# Patient Record
Sex: Male | Born: 2000 | Race: White | Hispanic: No | Marital: Single | Smoking: Current every day smoker
Health system: Southern US, Community
[De-identification: ages and names within clinical notes are randomized; demographics above are authoritative.]

## PROBLEM LIST (undated history)

## (undated) DIAGNOSIS — F32A Depression, unspecified: Secondary | ICD-10-CM

## (undated) DIAGNOSIS — R41840 Attention and concentration deficit: Secondary | ICD-10-CM

## (undated) DIAGNOSIS — F419 Anxiety disorder, unspecified: Secondary | ICD-10-CM

## (undated) DIAGNOSIS — M24021 Loose body in right elbow: Secondary | ICD-10-CM

---
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
## 2020-01-09 NOTE — Telephone Encounter (Signed)
Called mom a few times to let her know that he would need to be seen for this issue --call keeps going to voice mail

## 2020-06-17 DIAGNOSIS — M24021 Loose body in right elbow: Secondary | ICD-10-CM | POA: Diagnosis not present

## 2020-07-01 DIAGNOSIS — M25521 Pain in right elbow: Secondary | ICD-10-CM | POA: Diagnosis not present

## 2020-07-17 DIAGNOSIS — M24021 Loose body in right elbow: Secondary | ICD-10-CM | POA: Diagnosis not present

## 2020-07-21 ENCOUNTER — Encounter (HOSPITAL_BASED_OUTPATIENT_CLINIC_OR_DEPARTMENT_OTHER): Payer: Self-pay | Admitting: Orthopedic Surgery

## 2020-07-21 ENCOUNTER — Other Ambulatory Visit: Payer: Self-pay

## 2020-07-22 ENCOUNTER — Encounter (HOSPITAL_BASED_OUTPATIENT_CLINIC_OR_DEPARTMENT_OTHER): Payer: Self-pay | Admitting: Orthopedic Surgery

## 2020-07-22 DIAGNOSIS — F419 Anxiety disorder, unspecified: Secondary | ICD-10-CM | POA: Diagnosis present

## 2020-07-22 DIAGNOSIS — R41840 Attention and concentration deficit: Secondary | ICD-10-CM | POA: Diagnosis present

## 2020-07-22 DIAGNOSIS — F32A Depression, unspecified: Secondary | ICD-10-CM | POA: Diagnosis present

## 2020-07-22 DIAGNOSIS — M24021 Loose body in right elbow: Secondary | ICD-10-CM | POA: Diagnosis present

## 2020-07-22 NOTE — H&P (Signed)
Joshua Preston is an 19 y.o. male.   Chief Complaint: right elbow  HPI: Joshua Preston is an 19 year-old seen for follow up from his persistent right elbow pain.  He underwent an MRI that has revealed multiple loose bodies in the elbow, as well as some nodular synovitis in the posterior olecranon fossa, as well as partial thickness cartilage loss of the distal ulna, consistent with a previous OCD lesion.  He continues to have significant pain.  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  Past Medical History:  Diagnosis Date  . Anxiety   . Attention deficit   . Depression   . Loose body of right elbow     History reviewed. No pertinent surgical history.  Family History  Problem Relation Age of Onset  . ADD / ADHD Mother   . ADD / ADHD Sister   . Heart disease Maternal Grandmother   . Hypertension Maternal Grandmother   . Depression Maternal Grandfather   . Alcohol abuse Maternal Grandfather   . Arthritis Neg Hx   . Asthma Neg Hx   . Birth defects Neg Hx   . Cancer Neg Hx   . COPD Neg Hx   . Diabetes Neg Hx   . Drug abuse Neg Hx   . Early death Neg Hx   . Hearing loss Neg Hx   . Hyperlipidemia Neg Hx   . Kidney disease Neg Hx   . Varicose Veins Neg Hx   . Vision loss Neg Hx   . Stroke Neg Hx   . Miscarriages / Stillbirths Neg Hx   . Mental retardation Neg Hx   . Mental illness Neg Hx   . Learning disabilities Neg Hx    Social History:  reports that he has been smoking e-cigarettes. He has never used smokeless tobacco. He reports current alcohol use. He reports that he does not use drugs.  Allergies: No Known Allergies  No medications prior to admission.    No results found for this or any previous visit (from the past 48 hour(s)). No results found.  Review of Systems  Constitutional: Negative.   HENT: Negative.   Eyes: Negative.   Respiratory: Negative.   Cardiovascular: Negative.   Gastrointestinal:  Negative.   Endocrine: Negative.   Genitourinary: Negative.   Musculoskeletal: Positive for arthralgias and joint swelling.  Skin: Negative.   Allergic/Immunologic: Negative.   Neurological: Negative.   Hematological: Negative.   Psychiatric/Behavioral: Negative.     Height 5\' 10"  (1.778 m), weight 83.9 kg. Physical Exam Constitutional:      Appearance: Normal appearance.  HENT:     Head: Atraumatic.     Left Ear: External ear normal.     Nose: Nose normal.     Mouth/Throat:     Mouth: Mucous membranes are moist.     Pharynx: Oropharynx is clear.  Eyes:     Extraocular Movements: Extraocular movements intact.     Conjunctiva/sclera: Conjunctivae normal.  Cardiovascular:     Rate and Rhythm: Normal rate.     Pulses: Normal pulses.  Pulmonary:     Effort: Pulmonary effort is normal.  Abdominal:     General: Bowel sounds are normal.     Palpations: Abdomen is soft.  Genitourinary:    Comments: Not pertinent to current symptomatology therefore not examined. Musculoskeletal:     Cervical back: Neck supple.     Comments:  Examination of his right elbow reveals pain  with 1+ effusion.  Range of motion -5 to 130 degrees.  Elbow is stable.    Neurological:     Mental Status: He is alert.      Assessment Principal Problem:   Loose body of right elbow Active Problems:   Depression   Attention deficit   Anxiety   Plan At this point I have talked to him about this predicament in detail.  Would recommend with these findings and lack of response to conservative care that we proceed with right elbow arthroscopy with loose body excision and synovectomy.  Risks, complications and benefits of the surgery have been described to him in detail and he understands this completely.  We place him out of work today for the next two months.  Place him in a spider elbow sleeve.  We will plan on setting him up for this procedure at some point in the near future.   Verline Kong J Vetta Couzens,  PA-C 07/22/2020, 2:40 PM

## 2020-07-24 ENCOUNTER — Other Ambulatory Visit (HOSPITAL_COMMUNITY)
Admission: RE | Admit: 2020-07-24 | Discharge: 2020-07-24 | Disposition: A | Discharge status: Home / Self Care | Payer: Medicaid Other | Source: Ambulatory Visit | Attending: Orthopedic Surgery | Admitting: Orthopedic Surgery

## 2020-07-24 DIAGNOSIS — Z01818 Encounter for other preprocedural examination: Secondary | ICD-10-CM | POA: Diagnosis not present

## 2020-07-24 DIAGNOSIS — Z20822 Contact with and (suspected) exposure to covid-19: Secondary | ICD-10-CM | POA: Diagnosis not present

## 2020-07-24 LAB — SARS CORONAVIRUS 2 (TAT 6-24 HRS): SARS Coronavirus 2: NEGATIVE

## 2020-07-28 ENCOUNTER — Encounter (HOSPITAL_BASED_OUTPATIENT_CLINIC_OR_DEPARTMENT_OTHER)
Admission: RE | Disposition: A | Discharge status: Home / Self Care | Payer: Self-pay | Source: Home / Self Care | Attending: Orthopedic Surgery

## 2020-07-28 ENCOUNTER — Ambulatory Visit (HOSPITAL_BASED_OUTPATIENT_CLINIC_OR_DEPARTMENT_OTHER): Payer: Medicaid Other | Admitting: Anesthesiology

## 2020-07-28 ENCOUNTER — Other Ambulatory Visit: Payer: Self-pay

## 2020-07-28 ENCOUNTER — Encounter (HOSPITAL_BASED_OUTPATIENT_CLINIC_OR_DEPARTMENT_OTHER): Payer: Self-pay | Admitting: Orthopedic Surgery

## 2020-07-28 ENCOUNTER — Ambulatory Visit (HOSPITAL_BASED_OUTPATIENT_CLINIC_OR_DEPARTMENT_OTHER)
Admission: RE | Admit: 2020-07-28 | Discharge: 2020-07-28 | Disposition: A | Discharge status: Home / Self Care | Payer: Medicaid Other | Attending: Orthopedic Surgery | Admitting: Orthopedic Surgery

## 2020-07-28 DIAGNOSIS — M24021 Loose body in right elbow: Secondary | ICD-10-CM | POA: Diagnosis present

## 2020-07-28 DIAGNOSIS — F419 Anxiety disorder, unspecified: Secondary | ICD-10-CM | POA: Diagnosis present

## 2020-07-28 DIAGNOSIS — F17290 Nicotine dependence, other tobacco product, uncomplicated: Secondary | ICD-10-CM | POA: Insufficient documentation

## 2020-07-28 DIAGNOSIS — M93221 Osteochondritis dissecans, right elbow: Secondary | ICD-10-CM | POA: Diagnosis not present

## 2020-07-28 DIAGNOSIS — S53401A Unspecified sprain of right elbow, initial encounter: Secondary | ICD-10-CM | POA: Diagnosis not present

## 2020-07-28 DIAGNOSIS — F32A Depression, unspecified: Secondary | ICD-10-CM | POA: Diagnosis present

## 2020-07-28 DIAGNOSIS — M94221 Chondromalacia, right elbow: Secondary | ICD-10-CM | POA: Diagnosis not present

## 2020-07-28 DIAGNOSIS — R41840 Attention and concentration deficit: Secondary | ICD-10-CM | POA: Diagnosis present

## 2020-07-28 DIAGNOSIS — X58XXXA Exposure to other specified factors, initial encounter: Secondary | ICD-10-CM | POA: Diagnosis not present

## 2020-07-28 DIAGNOSIS — F418 Other specified anxiety disorders: Secondary | ICD-10-CM | POA: Diagnosis not present

## 2020-07-28 HISTORY — PX: ELBOW ARTHROSCOPY: SHX614

## 2020-07-28 HISTORY — DX: Depression, unspecified: F32.A

## 2020-07-28 HISTORY — DX: Anxiety disorder, unspecified: F41.9

## 2020-07-28 HISTORY — DX: Loose body in right elbow: M24.021

## 2020-07-28 SURGERY — ARTHROSCOPY, ELBOW
Anesthesia: General | Site: Elbow | Laterality: Right

## 2020-07-28 MED ORDER — MIDAZOLAM HCL 2 MG/2ML IJ SOLN
INTRAMUSCULAR | Status: AC
  Filled 2020-07-28: qty 2

## 2020-07-28 MED ORDER — ONDANSETRON HCL 4 MG/2ML IJ SOLN
INTRAMUSCULAR | Status: AC
  Filled 2020-07-28: qty 2

## 2020-07-28 MED ORDER — FENTANYL CITRATE (PF) 100 MCG/2ML IJ SOLN
INTRAMUSCULAR | Status: DC | PRN
  Administered 2020-07-28 (×2): 50 ug via INTRAVENOUS
  Administered 2020-07-28: 100 ug via INTRAVENOUS

## 2020-07-28 MED ORDER — PROPOFOL 10 MG/ML IV BOLUS
INTRAVENOUS | Status: AC
  Filled 2020-07-28: qty 20

## 2020-07-28 MED ORDER — FENTANYL CITRATE (PF) 100 MCG/2ML IJ SOLN
INTRAMUSCULAR | Status: AC
  Filled 2020-07-28: qty 2

## 2020-07-28 MED ORDER — LIDOCAINE 2% (20 MG/ML) 5 ML SYRINGE
INTRAMUSCULAR | Status: AC
  Filled 2020-07-28: qty 5

## 2020-07-28 MED ORDER — DEXAMETHASONE SODIUM PHOSPHATE 10 MG/ML IJ SOLN: 8.0000 mg | Freq: Once | INTRAMUSCULAR | Status: DC

## 2020-07-28 MED ORDER — SODIUM CHLORIDE 0.9 % IR SOLN
Status: DC | PRN
  Administered 2020-07-28: 1500 mL

## 2020-07-28 MED ORDER — BUPIVACAINE-EPINEPHRINE 0.25% -1:200000 IJ SOLN
INTRAMUSCULAR | Status: DC | PRN
  Administered 2020-07-28: 20 mL

## 2020-07-28 MED ORDER — POVIDONE-IODINE 10 % EX SWAB: 2.0000 "application " | Freq: Once | CUTANEOUS | Status: DC

## 2020-07-28 MED ORDER — HYDROCODONE-ACETAMINOPHEN 5-325 MG PO TABS: 1.0000 | ORAL_TABLET | ORAL | 0 refills | Status: DC | PRN

## 2020-07-28 MED ORDER — DEXAMETHASONE SODIUM PHOSPHATE 10 MG/ML IJ SOLN
INTRAMUSCULAR | Status: DC | PRN
  Administered 2020-07-28: 10 mg via INTRAVENOUS

## 2020-07-28 MED ORDER — ONDANSETRON HCL 4 MG/2ML IJ SOLN
INTRAMUSCULAR | Status: DC | PRN
  Administered 2020-07-28: 4 mg via INTRAVENOUS

## 2020-07-28 MED ORDER — PROPOFOL 10 MG/ML IV BOLUS
INTRAVENOUS | Status: DC | PRN
  Administered 2020-07-28: 160 mg via INTRAVENOUS

## 2020-07-28 MED ORDER — EPINEPHRINE PF 1 MG/ML IJ SOLN
INTRAMUSCULAR | Status: AC
  Filled 2020-07-28: qty 1

## 2020-07-28 MED ORDER — LACTATED RINGERS IV SOLN: INTRAVENOUS | Status: DC

## 2020-07-28 MED ORDER — SUGAMMADEX SODIUM 200 MG/2ML IV SOLN
INTRAVENOUS | Status: DC | PRN
  Administered 2020-07-28: 200 mg via INTRAVENOUS

## 2020-07-28 MED ORDER — DEXAMETHASONE SODIUM PHOSPHATE 10 MG/ML IJ SOLN
INTRAMUSCULAR | Status: AC
  Filled 2020-07-28: qty 1

## 2020-07-28 MED ORDER — MIDAZOLAM HCL 5 MG/5ML IJ SOLN
INTRAMUSCULAR | Status: DC | PRN
  Administered 2020-07-28: 2 mg via INTRAVENOUS

## 2020-07-28 MED ORDER — OXYCODONE HCL 5 MG PO TABS
ORAL_TABLET | ORAL | Status: AC
  Filled 2020-07-28: qty 1

## 2020-07-28 MED ORDER — MEPERIDINE HCL 25 MG/ML IJ SOLN: 6.2500 mg | INTRAMUSCULAR | Status: DC | PRN

## 2020-07-28 MED ORDER — PROMETHAZINE HCL 25 MG/ML IJ SOLN: 6.2500 mg | INTRAMUSCULAR | Status: DC | PRN

## 2020-07-28 MED ORDER — AMISULPRIDE (ANTIEMETIC) 5 MG/2ML IV SOLN: 10.0000 mg | Freq: Once | INTRAVENOUS | Status: DC | PRN

## 2020-07-28 MED ORDER — OXYCODONE HCL 5 MG PO TABS
5.0000 mg | ORAL_TABLET | Freq: Once | ORAL | Status: AC | PRN
  Administered 2020-07-28: 5 mg via ORAL

## 2020-07-28 MED ORDER — OXYCODONE HCL 5 MG/5ML PO SOLN: 5.0000 mg | Freq: Once | ORAL | Status: AC | PRN

## 2020-07-28 MED ORDER — CEFAZOLIN SODIUM-DEXTROSE 2-4 GM/100ML-% IV SOLN
INTRAVENOUS | Status: AC
  Filled 2020-07-28: qty 100

## 2020-07-28 MED ORDER — CEFAZOLIN SODIUM-DEXTROSE 2-4 GM/100ML-% IV SOLN
2.0000 g | INTRAVENOUS | Status: AC
  Administered 2020-07-28: 2 g via INTRAVENOUS

## 2020-07-28 MED ORDER — ROCURONIUM BROMIDE 100 MG/10ML IV SOLN
INTRAVENOUS | Status: DC | PRN
  Administered 2020-07-28: 80 mg via INTRAVENOUS

## 2020-07-28 MED ORDER — HYDROMORPHONE HCL 1 MG/ML IJ SOLN
0.2500 mg | INTRAMUSCULAR | Status: DC | PRN
  Administered 2020-07-28: 0.25 mg via INTRAVENOUS

## 2020-07-28 MED ORDER — HYDROMORPHONE HCL 1 MG/ML IJ SOLN
INTRAMUSCULAR | Status: AC
  Filled 2020-07-28: qty 0.5

## 2020-07-28 MED ORDER — POVIDONE-IODINE 7.5 % EX SOLN: Freq: Once | CUTANEOUS | Status: DC

## 2020-07-28 MED ORDER — LIDOCAINE 2% (20 MG/ML) 5 ML SYRINGE
INTRAMUSCULAR | Status: DC | PRN
  Administered 2020-07-28: 80 mg via INTRAVENOUS

## 2020-07-28 MED ORDER — BUPIVACAINE-EPINEPHRINE (PF) 0.25% -1:200000 IJ SOLN
INTRAMUSCULAR | Status: AC
  Filled 2020-07-28: qty 30

## 2020-07-28 SURGICAL SUPPLY — 63 items
APL SKNCLS STERI-STRIP NONHPOA (GAUZE/BANDAGES/DRESSINGS)
BENZOIN TINCTURE PRP APPL 2/3 (GAUZE/BANDAGES/DRESSINGS) IMPLANT
BLADE EXCALIBUR 4.0MM X 13CM (MISCELLANEOUS)
BLADE EXCALIBUR 4.0X13 (MISCELLANEOUS) IMPLANT
BNDG CMPR 9X4 STRL LF SNTH (GAUZE/BANDAGES/DRESSINGS) ×1
BNDG COHESIVE 4X5 TAN STRL (GAUZE/BANDAGES/DRESSINGS) IMPLANT
BNDG CONFORM 3 STRL LF (GAUZE/BANDAGES/DRESSINGS) IMPLANT
BNDG ELASTIC 4X5.8 VLCR STR LF (GAUZE/BANDAGES/DRESSINGS) ×6 IMPLANT
BNDG ESMARK 4X9 LF (GAUZE/BANDAGES/DRESSINGS) ×2 IMPLANT
CANISTER SUCT 1200ML W/VALVE (MISCELLANEOUS) ×3 IMPLANT
CLOSURE WOUND 1/2 X4 (GAUZE/BANDAGES/DRESSINGS)
COVER WAND RF STERILE (DRAPES) IMPLANT
CUFF TOURN SGL QUICK 18X4 (TOURNIQUET CUFF) IMPLANT
DECANTER SPIKE VIAL GLASS SM (MISCELLANEOUS) IMPLANT
DISSECTOR  3.8MM X 13CM (MISCELLANEOUS) ×3
DISSECTOR 3.8MM X 13CM (MISCELLANEOUS) ×1 IMPLANT
DISSECTOR 4.0MM X 13CM (MISCELLANEOUS) IMPLANT
DRAPE ARTHROSCOPY W/POUCH 90 (DRAPES) ×3 IMPLANT
DRAPE OEC MINIVIEW 54X84 (DRAPES) IMPLANT
DRAPE U-SHAPE 47X51 STRL (DRAPES) ×3 IMPLANT
DURAPREP 26ML APPLICATOR (WOUND CARE) ×3 IMPLANT
EXCALIBUR 3.8MM X 13CM (MISCELLANEOUS) IMPLANT
GAUZE SPONGE 4X4 12PLY STRL (GAUZE/BANDAGES/DRESSINGS) ×3 IMPLANT
GAUZE XEROFORM 1X8 LF (GAUZE/BANDAGES/DRESSINGS) ×2 IMPLANT
GLOVE BIO SURGEON STRL SZ7 (GLOVE) ×3 IMPLANT
GLOVE BIOGEL PI IND STRL 7.0 (GLOVE) ×1 IMPLANT
GLOVE BIOGEL PI IND STRL 7.5 (GLOVE) ×1 IMPLANT
GLOVE BIOGEL PI INDICATOR 7.0 (GLOVE) ×2
GLOVE BIOGEL PI INDICATOR 7.5 (GLOVE) ×2
GLOVE SS BIOGEL STRL SZ 7.5 (GLOVE) ×1 IMPLANT
GLOVE SUPERSENSE BIOGEL SZ 7.5 (GLOVE) ×2
GOWN STRL REUS W/ TWL LRG LVL3 (GOWN DISPOSABLE) ×2 IMPLANT
GOWN STRL REUS W/ TWL XL LVL3 (GOWN DISPOSABLE) ×2 IMPLANT
GOWN STRL REUS W/TWL LRG LVL3 (GOWN DISPOSABLE) ×6
GOWN STRL REUS W/TWL XL LVL3 (GOWN DISPOSABLE) ×6
NDL HYPO 25X1 1.5 SAFETY (NEEDLE) IMPLANT
NDL SAFETY ECLIPSE 18X1.5 (NEEDLE) IMPLANT
NEEDLE HYPO 18GX1.5 SHARP (NEEDLE)
NEEDLE HYPO 25X1 1.5 SAFETY (NEEDLE) IMPLANT
NS IRRIG 1000ML POUR BTL (IV SOLUTION) IMPLANT
PACK ARTHROSCOPY DSU (CUSTOM PROCEDURE TRAY) ×3 IMPLANT
PACK BASIN DAY SURGERY FS (CUSTOM PROCEDURE TRAY) ×3 IMPLANT
PAD CAST 4YDX4 CTTN HI CHSV (CAST SUPPLIES) ×3 IMPLANT
PADDING CAST ABS 4INX4YD NS (CAST SUPPLIES) ×2
PADDING CAST ABS COTTON 4X4 ST (CAST SUPPLIES) ×1 IMPLANT
PADDING CAST COTTON 4X4 STRL (CAST SUPPLIES) ×9
SHEET MEDIUM DRAPE 40X70 STRL (DRAPES) ×3 IMPLANT
SLEEVE SCD COMPRESS KNEE MED (MISCELLANEOUS) IMPLANT
SLING ARM FOAM STRAP LRG (SOFTGOODS) IMPLANT
SPLINT FAST PLASTER 5X30 (CAST SUPPLIES)
SPLINT PLASTER CAST FAST 5X30 (CAST SUPPLIES) IMPLANT
STRIP CLOSURE SKIN 1/2X4 (GAUZE/BANDAGES/DRESSINGS) IMPLANT
SUT ETHILON 4 0 PS 2 18 (SUTURE) ×3 IMPLANT
SUT VIC AB 0 CT1 27 (SUTURE)
SUT VIC AB 0 CT1 27XBRD ANBCTR (SUTURE) IMPLANT
SUT VIC AB 2-0 PS2 27 (SUTURE) IMPLANT
SUT VIC AB 2-0 SH 27 (SUTURE) ×3
SUT VIC AB 2-0 SH 27XBRD (SUTURE) ×1 IMPLANT
SYR CONTROL 10ML LL (SYRINGE) IMPLANT
TOWEL GREEN STERILE FF (TOWEL DISPOSABLE) ×3 IMPLANT
TUBING ARTHROSCOPY IRRIG 16FT (MISCELLANEOUS) ×3 IMPLANT
UNDERPAD 30X36 HEAVY ABSORB (UNDERPADS AND DIAPERS) ×3 IMPLANT
WATER STERILE IRR 1000ML POUR (IV SOLUTION) ×3 IMPLANT

## 2020-07-28 NOTE — Anesthesia Preprocedure Evaluation (Signed)
Anesthesia Evaluation  Patient identified by MRN, date of birth, ID band Patient awake    Reviewed: Allergy & Precautions, NPO status , Patient's Chart, lab work & pertinent test results  Airway Mallampati: II  TM Distance: >3 FB Neck ROM: Full    Dental no notable dental hx.    Pulmonary neg pulmonary ROS, Current Smoker and Patient abstained from smoking.,    Pulmonary exam normal breath sounds clear to auscultation       Cardiovascular negative cardio ROS Normal cardiovascular exam Rhythm:Regular Rate:Normal     Neuro/Psych Anxiety Depression negative neurological ROS  negative psych ROS   GI/Hepatic negative GI ROS, Neg liver ROS,   Endo/Other  negative endocrine ROS  Renal/GU negative Renal ROS  negative genitourinary   Musculoskeletal negative musculoskeletal ROS (+)   Abdominal   Peds negative pediatric ROS (+)  Hematology negative hematology ROS (+)   Anesthesia Other Findings   Reproductive/Obstetrics negative OB ROS                             Anesthesia Physical Anesthesia Plan  ASA: II  Anesthesia Plan: General   Post-op Pain Management:    Induction: Intravenous  PONV Risk Score and Plan: 1 and Ondansetron and Treatment may vary due to age or medical condition  Airway Management Planned: Oral ETT  Additional Equipment:   Intra-op Plan:   Post-operative Plan: Extubation in OR  Informed Consent: I have reviewed the patients History and Physical, chart, labs and discussed the procedure including the risks, benefits and alternatives for the proposed anesthesia with the patient or authorized representative who has indicated his/her understanding and acceptance.     Dental advisory given  Plan Discussed with: CRNA  Anesthesia Plan Comments:         Anesthesia Quick Evaluation

## 2020-07-28 NOTE — Transfer of Care (Signed)
Immediate Anesthesia Transfer of Care Note  Patient: Joshua Preston  Procedure(s) Performed: ARTHROSCOPY RIGHT ELBOW WITH DEBRIDEMENT AND LOOSE BODY EXCISION (Right Elbow)  Patient Location: PACU  Anesthesia Type:General  Level of Consciousness: awake, alert  and oriented  Airway & Oxygen Therapy: Patient Spontanous Breathing and Patient connected to face mask oxygen  Post-op Assessment: Report given to RN and Post -op Vital signs reviewed and stable  Post vital signs: Reviewed and stable  Last Vitals:  Vitals Value Taken Time  BP 140/81 07/28/20 1247  Temp 36.7 C 07/28/20 1247  Pulse 79 07/28/20 1250  Resp 19 07/28/20 1250  SpO2 100 % 07/28/20 1250  Vitals shown include unvalidated device data.  Last Pain:  Vitals:   07/28/20 0945  TempSrc: Oral  PainSc: 4       Patients Stated Pain Goal: 5 (07/28/20 0945)  Complications: No complications documented.

## 2020-07-28 NOTE — Interval H&P Note (Signed)
History and Physical Interval Note:  07/28/2020 11:00 AM  Joshua Preston  has presented today for surgery, with the diagnosis of LOOSE BODY IN RIGHT ELBOW.  The various methods of treatment have been discussed with the patient and family. After consideration of risks, benefits and other options for treatment, the patient has consented to  Procedure(s): ARTHROSCOPY RIGHT ELBOW WITH DEBRIDEMENT AND LOOSE BODY EXCISION (Right) as a surgical intervention.  [name] history has been reviewed, patient examined, no change in status, stable for surgery.  I have reviewed the patient's chart and labs.  Questions were answered to the patient's satisfaction.     Nilda Simmer

## 2020-07-28 NOTE — Anesthesia Postprocedure Evaluation (Signed)
Anesthesia Post Note  Patient: Joshua Preston  Procedure(s) Performed: ARTHROSCOPY RIGHT ELBOW WITH DEBRIDEMENT AND LOOSE BODY EXCISION (Right Elbow)     Patient location during evaluation: PACU Anesthesia Type: General Level of consciousness: awake and alert Pain management: pain level controlled Vital Signs Assessment: post-procedure vital signs reviewed and stable Respiratory status: spontaneous breathing, nonlabored ventilation and respiratory function stable Cardiovascular status: blood pressure returned to baseline and stable Postop Assessment: no apparent nausea or vomiting Anesthetic complications: no   No complications documented.  Last Vitals:  Vitals:   07/28/20 1315 07/28/20 1325  BP: 134/86 135/81  Pulse: 76 65  Resp: 17 16  Temp:    SpO2: 93% 94%    Last Pain:  Vitals:   07/28/20 1300  TempSrc:   PainSc: Asleep                 Lowella Curb

## 2020-07-28 NOTE — Anesthesia Procedure Notes (Signed)
Procedure Name: Intubation Date/Time: 07/28/2020 11:43 AM Performed by: Lavonia Dana, CRNA Pre-anesthesia Checklist: Patient identified, Emergency Drugs available, Suction available and Patient being monitored Patient Re-evaluated:Patient Re-evaluated prior to induction Oxygen Delivery Method: Circle system utilized Preoxygenation: Pre-oxygenation with 100% oxygen Induction Type: IV induction Ventilation: Mask ventilation without difficulty Laryngoscope Size: Mac and 4 Grade View: Grade I Tube type: Oral Tube size: 7.5 mm Number of attempts: 1 Airway Equipment and Method: Stylet and Bite block Placement Confirmation: ETT inserted through vocal cords under direct vision,  positive ETCO2 and breath sounds checked- equal and bilateral Secured at: 24 cm Tube secured with: Tape Dental Injury: Teeth and Oropharynx as per pre-operative assessment

## 2020-07-28 NOTE — Discharge Instructions (Signed)

## 2020-07-29 ENCOUNTER — Encounter (HOSPITAL_BASED_OUTPATIENT_CLINIC_OR_DEPARTMENT_OTHER): Payer: Self-pay | Admitting: Orthopedic Surgery

## 2020-07-29 NOTE — Op Note (Signed)
NAME: Joshua Preston, WIECK MEDICAL RECORD ZD:[id number] ACCOUNT [id number] DATE OF BIRTH:2001-02-04 FACILITY: [facility] LOCATION: [facility] PHYSICIAN:Chukwudi Ewen Salley Slaughter, MD  OPERATIVE REPORT  DATE OF PROCEDURE:  07/28/2020  PREOPERATIVE DIAGNOSES: 1.  Right elbow chronic traumatic loose bodies. 2.  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 3.  Right elbow chronic traumatic synovitis.  POSTOPERATIVE DIAGNOSES: 1.  Right elbow chronic traumatic loose bodies. 2.  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 3.  Right elbow chronic traumatic synovitis.  PROCEDURE: 1.  Right elbow EUA followed by arthroscopic excision of loose bodies and chondroplasty. 2.  Right elbow synovectomy.  SURGEON:  Salvatore Marvel, MD  ASSISTANT:  Julien Girt, PA  ANESTHESIA:  General.  OPERATIVE TIME:  45 minutes.  COMPLICATIONS:  None.  INDICATIONS FOR PROCEDURE:  The patient is an 19 year old who has had repetitive trauma to his right elbow over the past 1 to 2 years.  X-rays, exam and MRI have revealed multiple loose bodies with synovitis and evidence of a previous OCD lesion.  He has  failed conservative care and is now to undergo arthroscopy.  DESCRIPTION:  The patient was brought to the operating room on 07/28/2020.  He was placed under general anesthesia on a stretcher and then carefully turned prone on the operative table.  All pressure points and genitalia well padded.  His right elbow was  examined.  Range of motion from -5 to 135 degrees, the elbow was stable.  Elbow was sterilely injected in the anteromedial, anterolateral and posterior portals with 0.25% Marcaine with epinephrine.  The right elbow and arm was prepped using sterile  DuraPrep and draped using sterile technique.  Timeout procedure was called and the correct right elbow identified.  Initially through an anteromedial portal, carefully incising only the skin to  protect any injury to the ulnar nerve, the arthroscopic  cannula was placed without complications.  An anterolateral portal was made, also carefully protecting for any injury and preventing any injury to the radial nerve.  An arthroscopic probe was placed.  On initial inspection of the anterior compartment of  the elbow, there was moderate synovitis which was debrided.  The capitellum had chondromalacia grade III, which was debrided 50%.  Radial head showed grade I and II changes.  The coronoid and distal humerus articulation was intact.  No other pathology  was noted in the anterior compartment.  At this point, 2 posterior arthroscopic portals were made, one central posterior and one posterolateral.  The olecranon fossa was entered.  Significant synovitis was debrided.  There were 2 large loose bodies  posteriorly.  One measured 1 x 1.5 cm, one measured 1.5 x 2 cm.  A posterolateral portal had to be extended to remove these loose bodies and they were removed.  The medial and lateral gutters were inspected and there were no loose bodies or pathology  noted there.  After the synovectomy had been carried out and the loose bodies removed, there was no other pathology noted and it was felt that all pathology had been satisfactorily addressed.  The instruments were removed.  Portals closed with 3-0 nylon  suture.  Sterile dressings were applied and then the patient turned supine, awakened and extubated and taken to recovery room in stable condition.  Needle and sponge counts correct x2 at the end of the case.  FOLLOWUP CARE:  The patient will be followed as an outpatient, on Norco for pain.  I will see him back in the office in a week for  sutures out and followup.  HN/NUANCE  D:07/28/2020 T:07/29/2020 JOB:[id number]

## 2020-08-05 DIAGNOSIS — M24021 Loose body in right elbow: Secondary | ICD-10-CM | POA: Diagnosis not present

## 2020-08-09 DIAGNOSIS — M6281 Muscle weakness (generalized): Secondary | ICD-10-CM | POA: Diagnosis not present

## 2020-08-09 DIAGNOSIS — M25621 Stiffness of right elbow, not elsewhere classified: Secondary | ICD-10-CM | POA: Diagnosis not present

## 2020-08-21 DIAGNOSIS — M25621 Stiffness of right elbow, not elsewhere classified: Secondary | ICD-10-CM | POA: Diagnosis not present

## 2020-08-21 DIAGNOSIS — M6281 Muscle weakness (generalized): Secondary | ICD-10-CM | POA: Diagnosis not present

## 2020-08-27 DIAGNOSIS — M6281 Muscle weakness (generalized): Secondary | ICD-10-CM | POA: Diagnosis not present

## 2020-08-27 DIAGNOSIS — M25621 Stiffness of right elbow, not elsewhere classified: Secondary | ICD-10-CM | POA: Diagnosis not present

## 2020-09-01 DIAGNOSIS — M24021 Loose body in right elbow: Secondary | ICD-10-CM | POA: Diagnosis not present

## 2020-09-03 DIAGNOSIS — M25621 Stiffness of right elbow, not elsewhere classified: Secondary | ICD-10-CM | POA: Diagnosis not present

## 2020-09-03 DIAGNOSIS — M6281 Muscle weakness (generalized): Secondary | ICD-10-CM | POA: Diagnosis not present

## 2020-09-11 DIAGNOSIS — M6281 Muscle weakness (generalized): Secondary | ICD-10-CM | POA: Diagnosis not present

## 2020-09-11 DIAGNOSIS — M25621 Stiffness of right elbow, not elsewhere classified: Secondary | ICD-10-CM | POA: Diagnosis not present

## 2020-10-15 ENCOUNTER — Other Ambulatory Visit: Payer: Self-pay

## 2020-10-15 ENCOUNTER — Ambulatory Visit (INDEPENDENT_AMBULATORY_CARE_PROVIDER_SITE_OTHER): Payer: Medicaid Other | Admitting: Pediatrics

## 2020-10-15 VITALS — Wt 190.0 lb

## 2020-10-15 DIAGNOSIS — J101 Influenza due to other identified influenza virus with other respiratory manifestations: Secondary | ICD-10-CM | POA: Diagnosis not present

## 2020-10-15 DIAGNOSIS — J029 Acute pharyngitis, unspecified: Secondary | ICD-10-CM

## 2020-10-15 LAB — POC SOFIA SARS ANTIGEN FIA: SARS:: NEGATIVE

## 2020-10-15 LAB — POCT INFLUENZA A: Rapid Influenza A Ag: NEGATIVE

## 2020-10-15 LAB — POCT RAPID STREP A (OFFICE): Rapid Strep A Screen: NEGATIVE

## 2020-10-15 LAB — POCT INFLUENZA B: Rapid Influenza B Ag: POSITIVE

## 2020-10-15 MED ORDER — OSELTAMIVIR PHOSPHATE 75 MG PO CAPS: 75.0000 mg | ORAL_CAPSULE | Freq: Two times a day (BID) | ORAL | 0 refills | Status: DC

## 2020-10-15 NOTE — Patient Instructions (Signed)
Influenza, Adult Influenza is also called "the flu." It is an infection in the lungs, nose, and throat (respiratory tract). It spreads easily from person to person (is contagious). The flu causes symptoms that are like a cold, along with high fever and body aches. What are the causes? This condition is caused by the influenza virus. You can get the virus by:  Breathing in droplets that are in the air after a person infected with the flu coughed or sneezed.  Touching something that has the virus on it and then touching your mouth, nose, or eyes. What increases the risk? Certain things may make you more likely to get the flu. These include:  Not washing your hands often.  Having close contact with many people during cold and flu season.  Touching your mouth, eyes, or nose without first washing your hands.  Not getting a flu shot every year. You may have a higher risk for the flu, and serious problems, such as a lung infection (pneumonia), if you:  Are older than 65.  Are pregnant.  Have a weakened disease-fighting system (immune system) because of a disease or because you are taking certain medicines.  Have a long-term (chronic) condition, such as: ? Heart, kidney, or lung disease. ? Diabetes. ? Asthma.  Have a liver disorder.  Are very overweight (morbidly obese).  Have anemia. What are the signs or symptoms? Symptoms usually begin suddenly and last 4-14 days. They may include:  Fever and chills.  Headaches, body aches, or muscle aches.  Sore throat.  Cough.  Runny or stuffy (congested) nose.  Feeling discomfort in your chest.  Not wanting to eat as much as normal.  Feeling weak or tired.  Feeling dizzy.  Feeling sick to your stomach or throwing up. How is this treated? If the flu is found early, you can be treated with antiviral medicine. This can help to reduce how bad the illness is and how long it lasts. This may be given by mouth or through an IV  tube. Taking care of yourself at home can help your symptoms get better. Your doctor may want you to:  Take over-the-counter medicines.  Drink plenty of fluids. The flu often goes away on its own. If you have very bad symptoms or other problems, you may be treated in a hospital. Follow these instructions at home: Activity  Rest as needed. Get plenty of sleep.  Stay home from work or school as told by your doctor. ? Do not leave home until you do not have a fever for 24 hours without taking medicine. ? Leave home only to go to your doctor. Eating and drinking  Take an ORS (oral rehydration solution). This is a drink that is sold at pharmacies and stores.  Drink enough fluid to keep your pee pale yellow.  Drink clear fluids in small amounts as you are able. Clear fluids include: ? Water. ? Ice chips. ? Fruit juice mixed with water. ? Low-calorie sports drinks.  Eat bland foods that are easy to digest. Eat small amounts as you are able. These foods include: ? Bananas. ? Applesauce. ? Rice. ? Lean meats. ? Toast. ? Crackers.  Do not eat or drink: ? Fluids that have a lot of sugar or caffeine. ? Alcohol. ? Spicy or fatty foods. General instructions  Take over-the-counter and prescription medicines only as told by your doctor.  Use a cool mist humidifier to add moisture to the air in your home. This can make it   easier for you to breathe. ? When using a cool mist humidifier, clean it daily. Empty water and replace with clean water.  Cover your mouth and nose when you cough or sneeze.  Wash your hands with soap and water often and for at least 20 seconds. This is also important after you cough or sneeze. If you cannot use soap and water, use alcohol-based hand sanitizer.  Keep all follow-up visits.      How is this prevented?  Get a flu shot every year. You may get the flu shot in late summer, fall, or winter. Ask your doctor when you should get your flu shot.  Avoid  contact with people who are sick during fall and winter. This is cold and flu season.   Contact a doctor if:  You get new symptoms.  You have: ? Chest pain. ? Watery poop (diarrhea). ? A fever.  Your cough gets worse.  You start to have more mucus.  You feel sick to your stomach.  You throw up. Get help right away if you:  Have shortness of breath.  Have trouble breathing.  Have skin or nails that turn a bluish color.  Have very bad pain or stiffness in your neck.  Get a sudden headache.  Get sudden pain in your face or ear.  Cannot eat or drink without throwing up. These symptoms may represent a serious problem that is an emergency. Get medical help right away. Call your local emergency services (911 in the [place]  Do not wait to see if the symptoms will go away.  Do not drive yourself to the hospital. Summary  Influenza is also called "the flu." It is an infection in the lungs, nose, and throat. It spreads easily from person to person.  Take over-the-counter and prescription medicines only as told by your doctor.  Getting a flu shot every year is the best way to not get the flu. This information is not intended to replace advice given to you by your health care provider. Make sure you discuss any questions you have with your health care provider. Document Revised: 04/25/2020 Document Reviewed: 04/25/2020 Elsevier Patient Education  2021 Elsevier Inc.  

## 2020-10-16 ENCOUNTER — Encounter: Payer: Self-pay | Admitting: Pediatrics

## 2020-10-16 DIAGNOSIS — J029 Acute pharyngitis, unspecified: Secondary | ICD-10-CM | POA: Insufficient documentation

## 2020-10-16 DIAGNOSIS — J101 Influenza due to other identified influenza virus with other respiratory manifestations: Secondary | ICD-10-CM | POA: Insufficient documentation

## 2020-10-16 NOTE — Progress Notes (Signed)
20 year old male who presents with nasal congestion and sore throat but no high for one day. No vomiting and no diarrhea. No rash, mild cough and  congestion . Associated symptoms include decreased appetite and poor sleep.   Review of Systems  Constitutional: Positive for fever, body aches and sore throat. Negative for chills, activity change and appetite change.  HENT:  Negative for cough, congestion, ear pain, trouble swallowing, voice change, tinnitus and ear discharge.   Eyes: Negative for discharge, redness and itching.  Respiratory:  Negative for cough and wheezing.   Cardiovascular: Negative for chest pain.  Gastrointestinal: Negative for nausea, vomiting and diarrhea. Musculoskeletal: Negative for arthralgias.  Skin: Negative for rash.  Neurological: Negative for weakness and headaches.  Hematological: Negative       Objective:   Physical Exam  Constitutional: Appears well-developed and well-nourished.   HENT:  Right Ear: Tympanic membrane normal.  Left Ear: Tympanic membrane normal.  Nose: Mucoid nasal discharge.  Mouth/Throat: Mucous membranes are moist. No dental caries. No tonsillar exudate. Pharynx is erythematous without palatal petichea..  Eyes: Pupils are equal, round, and reactive to light.  Neck: Normal range of motion. Cardiovascular: Regular rhythm.  No murmur heard. Pulmonary/Chest: Effort normal and breath sounds normal. No nasal flaring. No respiratory distress. No wheezes and no retraction.  Abdominal: Soft. Bowel sounds are normal. No distension. There is no tenderness.  Musculoskeletal: Normal range of motion.  Neurological: Alert. Active and oriented Skin: Skin is warm and moist. No rash noted.    Flu B was positive, Flu B negative     Assessment:      Influenza B    Plan:   Tamiflu X 5 days and follow as needed

## 2020-11-18 ENCOUNTER — Other Ambulatory Visit: Payer: Self-pay

## 2020-11-18 ENCOUNTER — Ambulatory Visit (INDEPENDENT_AMBULATORY_CARE_PROVIDER_SITE_OTHER): Payer: Medicaid Other | Admitting: Pediatrics

## 2020-11-18 VITALS — BP 150/85 | Ht 69.5 in | Wt 192.5 lb

## 2020-11-18 DIAGNOSIS — Z00129 Encounter for routine child health examination without abnormal findings: Secondary | ICD-10-CM

## 2020-11-18 DIAGNOSIS — I10 Essential (primary) hypertension: Secondary | ICD-10-CM

## 2020-11-18 DIAGNOSIS — M546 Pain in thoracic spine: Secondary | ICD-10-CM | POA: Diagnosis not present

## 2020-11-18 DIAGNOSIS — Z0001 Encounter for general adult medical examination with abnormal findings: Secondary | ICD-10-CM

## 2020-11-18 DIAGNOSIS — Z68.41 Body mass index (BMI) pediatric, 85th percentile to less than 95th percentile for age: Secondary | ICD-10-CM | POA: Diagnosis not present

## 2020-11-18 DIAGNOSIS — R39198 Other difficulties with micturition: Secondary | ICD-10-CM
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
## 2020-11-18 NOTE — Patient Instructions (Signed)
Preventive Care 18-21 Years Old, Male Preventive care refers to lifestyle choices and visits with your health care provider that can promote health and wellness. At this stage in your life, you may start seeing a primary care physician instead of a pediatrician. It is important to take responsibility for your health and well-being. Preventive care for young adults includes:  A yearly physical exam. This is also called an annual wellness visit.  Regular dental and eye exams.  Immunizations.  Screening for certain conditions.  Healthy lifestyle choices, such as: ? Eating a healthy diet. ? Getting regular exercise. ? Not using drugs or products that contain nicotine and tobacco. ? Limiting alcohol use. What can I expect for my preventive care visit? Physical exam Your health care provider may check your:  Height and weight. These may be used to calculate your BMI (body mass index). BMI is a measurement that tells if you are at a healthy weight.  Heart rate and blood pressure.  Body temperature.  Skin for abnormal spots. Counseling Your health care provider may ask you questions about your:  Past medical problems.  Family's medical history.  Alcohol, tobacco, and drug use.  Home life and relationship well-being.  Access to firearms.  Emotional well-being.  Diet, exercise, and sleep habits.  Sexual activity and sexual health. What immunizations do I need? Vaccines are usually given at various ages, according to a schedule. Your health care provider will recommend vaccines for you based on your age, medical history, and lifestyle or other factors, such as travel or where you work.   What tests do I need? Blood tests  Lipid and cholesterol levels. These may be checked every 5 years starting at age 20.  Hepatitis C test.  Hepatitis B test. Screening  Genital exam to check for testicular cancer or hernias.  STD (sexually transmitted disease) testing, if you are at  risk. Other tests  Tuberculosis skin test.  Vision and hearing tests.  Skin exam. Talk with your health care provider about your test results, treatment options, and if necessary, the need for more tests. Follow these instructions at home: Eating and drinking  Eat a healthy diet that includes fresh fruits and vegetables, whole grains, lean protein, and low-fat dairy products.  Drink enough fluid to keep your urine pale yellow.  Do not drink alcohol if: ? Your health care provider tells you not to drink. ? You are under the legal drinking age. In the [place] the legal drinking age is 21.  If you drink alcohol: ? Limit how much you use to 0-2 drinks a day. ? Be aware of how much alcohol is in your drink. In the [place] one drink equals one 12 oz bottle of beer (355 mL), one 5 oz glass of wine (148 mL), or one 1 oz glass of hard liquor (44 mL).   Lifestyle  Take daily care of your teeth and gums. Brush your teeth every morning and night with fluoride toothpaste. Floss one time each day.  Stay active. Exercise for at least 30 minutes 5 or more days of the week.  Do not use any products that contain nicotine or tobacco, such as cigarettes, e-cigarettes, and chewing tobacco. If you need help quitting, ask your health care provider.  Do not use drugs.  If you are sexually active, practice safe sex. Use a condom or other form of protection to prevent STIs (sexually transmitted infections).  Find healthy ways to cope with stress, such as: ? Meditation, yoga,   or listening to music. ? Journaling. ? Talking to a trusted person. ? Spending time with friends and family. Safety  Always wear your seat belt while driving or riding in a vehicle.  Do not drive: ? If you have been drinking alcohol. Do not ride with someone who has been drinking. ? When you are tired or distracted. ? While texting.  Wear a helmet and other protective equipment during sports activities.  If you have  firearms in your house, make sure you follow all gun safety procedures.  Seek help if you have been bullied, physically abused, or sexually abused.  Use the Internet responsibly to avoid dangers, such as online bullying and online sex predators. What's next?  Go to your health care provider once a year for an annual wellness visit.  Ask your health care provider how often you should have your eyes and teeth checked.  Stay up to date on all vaccines. This information is not intended to replace advice given to you by your health care provider. Make sure you discuss any questions you have with your health care provider. Document Revised: 05/23/2019 Document Reviewed: 08/31/2018 Elsevier Patient Education  2021 Elsevier Inc.  

## 2020-11-24 ENCOUNTER — Encounter: Payer: Self-pay | Admitting: Pediatrics

## 2020-11-24 DIAGNOSIS — M546 Pain in thoracic spine: Secondary | ICD-10-CM | POA: Insufficient documentation

## 2020-11-24 DIAGNOSIS — Z00129 Encounter for routine child health examination without abnormal findings: Secondary | ICD-10-CM | POA: Insufficient documentation

## 2020-11-24 DIAGNOSIS — I10 Essential (primary) hypertension: Secondary | ICD-10-CM | POA: Insufficient documentation

## 2020-12-05 ENCOUNTER — Ambulatory Visit: Payer: Medicaid Other | Admitting: Family Medicine

## 2020-12-08 ENCOUNTER — Other Ambulatory Visit: Payer: Self-pay

## 2020-12-08 ENCOUNTER — Encounter: Payer: Self-pay | Admitting: Family Medicine

## 2020-12-08 ENCOUNTER — Ambulatory Visit (INDEPENDENT_AMBULATORY_CARE_PROVIDER_SITE_OTHER): Payer: Medicaid Other | Admitting: Family Medicine

## 2020-12-08 DIAGNOSIS — M545 Low back pain, unspecified: Secondary | ICD-10-CM

## 2020-12-08 DIAGNOSIS — G8929 Other chronic pain: Secondary | ICD-10-CM | POA: Diagnosis not present
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
## 2020-12-15 ENCOUNTER — Ambulatory Visit: Payer: Medicaid Other | Admitting: Family Medicine

## 2020-12-15 NOTE — Progress Notes (Deleted)
   Subjective:    Patient ID: Joshua Preston, male    DOB: [birth year], 20 y.o.   MRN: [id number]   CC: Establish care  HPI:  Joshua Preston is a very pleasant 20 y.o. male who presents today to establish care.  Initial concerns:***  Past medical history:***  Past surgical history:***  Current medications:***  Family history:***  Social history:***  ROS: pertinent noted in the HPI   Objective:  There were no vitals taken for this visit.  Vitals and nursing note reviewed  General: NAD, pleasant, able to participate in exam Cardiac: RRR, S1 S2 present. normal heart sounds, no murmurs. Respiratory: CTAB, normal effort, No wheezes, rales or rhonchi Abdomen: Bowel sounds present, non-tender, non-distended, no hepatosplenomegaly Extremities: no edema or cyanosis. Skin: warm and dry, no rashes noted Neuro: alert, no obvious focal deficits Psych: Normal affect and mood   Assessment & Plan:    No problem-specific Assessment & Plan notes found for this encounter.    Jackelyn Poling, DO [facility] PGY-1

## 2021-01-05 DIAGNOSIS — T7840XA Allergy, unspecified, initial encounter: Secondary | ICD-10-CM | POA: Diagnosis not present

## 2021-03-13 DIAGNOSIS — J029 Acute pharyngitis, unspecified: Secondary | ICD-10-CM | POA: Diagnosis not present

## 2021-03-13 DIAGNOSIS — J0110 Acute frontal sinusitis, unspecified: Secondary | ICD-10-CM | POA: Diagnosis not present

## 2021-03-13 DIAGNOSIS — Z20822 Contact with and (suspected) exposure to covid-19: Secondary | ICD-10-CM | POA: Diagnosis not present

## 2021-03-13 DIAGNOSIS — R519 Headache, unspecified: Secondary | ICD-10-CM | POA: Diagnosis not present

## 2021-03-13 DIAGNOSIS — R5383 Other fatigue: Secondary | ICD-10-CM | POA: Diagnosis not present

## 2021-08-26 ENCOUNTER — Encounter (HOSPITAL_COMMUNITY)
Admission: EM | Disposition: A | Discharge status: Home / Self Care | Payer: Self-pay | Source: Home / Self Care | Attending: Emergency Medicine

## 2021-08-26 ENCOUNTER — Emergency Department (HOSPITAL_COMMUNITY): Payer: Medicaid Other | Admitting: Certified Registered Nurse Anesthetist

## 2021-08-26 ENCOUNTER — Encounter (HOSPITAL_COMMUNITY): Payer: Self-pay | Admitting: Certified Registered Nurse Anesthetist

## 2021-08-26 ENCOUNTER — Ambulatory Visit (HOSPITAL_COMMUNITY)
Admission: EM | Admit: 2021-08-26 | Discharge: 2021-08-26 | Disposition: A | Discharge status: Home / Self Care | Payer: Medicaid Other | Attending: Emergency Medicine | Admitting: Emergency Medicine

## 2021-08-26 ENCOUNTER — Emergency Department (HOSPITAL_COMMUNITY): Payer: Medicaid Other

## 2021-08-26 DIAGNOSIS — R109 Unspecified abdominal pain: Secondary | ICD-10-CM | POA: Diagnosis not present

## 2021-08-26 DIAGNOSIS — F17200 Nicotine dependence, unspecified, uncomplicated: Secondary | ICD-10-CM | POA: Insufficient documentation

## 2021-08-26 DIAGNOSIS — K3580 Unspecified acute appendicitis: Secondary | ICD-10-CM

## 2021-08-26 DIAGNOSIS — Z20822 Contact with and (suspected) exposure to covid-19: Secondary | ICD-10-CM | POA: Insufficient documentation

## 2021-08-26 DIAGNOSIS — R1031 Right lower quadrant pain: Secondary | ICD-10-CM | POA: Diagnosis not present

## 2021-08-26 DIAGNOSIS — K380 Hyperplasia of appendix: Secondary | ICD-10-CM | POA: Diagnosis not present

## 2021-08-26 HISTORY — PX: LAPAROSCOPIC APPENDECTOMY: SHX408

## 2021-08-26 LAB — CBC WITH DIFFERENTIAL/PLATELET
Abs Immature Granulocytes: 0 10*3/uL (ref 0.00–0.07)
Basophils Absolute: 0.2 10*3/uL — ABNORMAL HIGH (ref 0.0–0.1)
Basophils Relative: 2 %
Eosinophils Absolute: 0 10*3/uL (ref 0.0–0.5)
Eosinophils Relative: 0 %
HCT: 43.8 % (ref 39.0–52.0)
Hemoglobin: 14.3 g/dL (ref 13.0–17.0)
Lymphocytes Relative: 42 %
Lymphs Abs: 4.1 10*3/uL — ABNORMAL HIGH (ref 0.7–4.0)
MCH: 30.3 pg (ref 26.0–34.0)
MCHC: 32.6 g/dL (ref 30.0–36.0)
MCV: 92.8 fL (ref 80.0–100.0)
Monocytes Absolute: 1 10*3/uL (ref 0.1–1.0)
Monocytes Relative: 10 %
Neutro Abs: 4.5 10*3/uL (ref 1.7–7.7)
Neutrophils Relative %: 46 %
Platelets: 185 10*3/uL (ref 150–400)
RBC: 4.72 MIL/uL (ref 4.22–5.81)
RDW: 12.7 % (ref 11.5–15.5)
WBC: 9.8 10*3/uL (ref 4.0–10.5)
nRBC: 0 % (ref 0.0–0.2)
nRBC: 0 /100 WBC

## 2021-08-26 LAB — COMPREHENSIVE METABOLIC PANEL
ALT: 40 U/L (ref 0–44)
AST: 30 U/L (ref 15–41)
Albumin: 4.4 g/dL (ref 3.5–5.0)
Alkaline Phosphatase: 59 U/L (ref 38–126)
Anion gap: 9 (ref 5–15)
BUN: 12 mg/dL (ref 6–20)
CO2: 28 mmol/L (ref 22–32)
Calcium: 9.7 mg/dL (ref 8.9–10.3)
Chloride: 102 mmol/L (ref 98–111)
Creatinine, Ser: 1.09 mg/dL (ref 0.61–1.24)
GFR, Estimated: >60 mL/min (ref >60)
Glucose, Bld: 137 mg/dL — ABNORMAL HIGH (ref 70–99)
Potassium: 4.2 mmol/L (ref 3.5–5.1)
Sodium: 139 mmol/L (ref 135–145)
Total Bilirubin: 0.9 mg/dL (ref 0.3–1.2)
Total Protein: 6.9 g/dL (ref 6.5–8.1)

## 2021-08-26 LAB — URINALYSIS, ROUTINE W REFLEX MICROSCOPIC
Bilirubin Urine: NEGATIVE
Glucose, UA: NEGATIVE mg/dL
Hgb urine dipstick: NEGATIVE
Ketones, ur: NEGATIVE mg/dL
Leukocytes,Ua: NEGATIVE
Nitrite: NEGATIVE
Protein, ur: NEGATIVE mg/dL
Specific Gravity, Urine: 1.01 (ref 1.005–1.030)
pH: 6 (ref 5.0–8.0)

## 2021-08-26 LAB — RESP PANEL BY RT-PCR (FLU A&B, COVID) ARPGX2
Influenza A by PCR: NEGATIVE
Influenza B by PCR: NEGATIVE
SARS Coronavirus 2 by RT PCR: NEGATIVE

## 2021-08-26 LAB — LIPASE, BLOOD: Lipase: 32 U/L (ref 11–51)

## 2021-08-26 LAB — CK: Total CK: 183 U/L (ref 49–397)

## 2021-08-26 SURGERY — APPENDECTOMY, LAPAROSCOPIC
Anesthesia: General

## 2021-08-26 MED ORDER — PROMETHAZINE HCL 25 MG/ML IJ SOLN
6.2500 mg | INTRAMUSCULAR | Status: DC | PRN
Start: 2021-08-26 — End: 2021-08-27
  Administered 2021-08-26: 12.5 mg via INTRAVENOUS

## 2021-08-26 MED ORDER — DEXAMETHASONE SODIUM PHOSPHATE 10 MG/ML IJ SOLN
INTRAMUSCULAR | Status: DC | PRN
Start: 2021-08-26 — End: 2021-08-26
  Administered 2021-08-26: 5 mg via INTRAVENOUS

## 2021-08-26 MED ORDER — 0.9 % SODIUM CHLORIDE (POUR BTL) OPTIME
TOPICAL | Status: DC | PRN
  Administered 2021-08-26: 1000 mL

## 2021-08-26 MED ORDER — SODIUM CHLORIDE 0.9 % IR SOLN
Status: DC | PRN
  Administered 2021-08-26: 1000 mL

## 2021-08-26 MED ORDER — ROCURONIUM BROMIDE 100 MG/10ML IV SOLN
INTRAVENOUS | Status: DC | PRN
  Administered 2021-08-26: 70 mg via INTRAVENOUS

## 2021-08-26 MED ORDER — ORAL CARE MOUTH RINSE: 15.0000 mL | Freq: Once | OROMUCOSAL | Status: AC

## 2021-08-26 MED ORDER — SUGAMMADEX SODIUM 200 MG/2ML IV SOLN
INTRAVENOUS | Status: DC | PRN
  Administered 2021-08-26: 300 mg via INTRAVENOUS

## 2021-08-26 MED ORDER — MIDAZOLAM HCL 2 MG/2ML IJ SOLN
INTRAMUSCULAR | Status: AC
  Filled 2021-08-26: qty 2

## 2021-08-26 MED ORDER — FENTANYL CITRATE (PF) 100 MCG/2ML IJ SOLN
INTRAMUSCULAR | Status: AC
  Filled 2021-08-26: qty 2

## 2021-08-26 MED ORDER — SODIUM CHLORIDE 0.9 % IV BOLUS
1000.0000 mL | Freq: Once | INTRAVENOUS | Status: AC
  Administered 2021-08-26: 1000 mL via INTRAVENOUS

## 2021-08-26 MED ORDER — BUPIVACAINE-EPINEPHRINE (PF) 0.25% -1:200000 IJ SOLN
INTRAMUSCULAR | Status: AC
  Filled 2021-08-26: qty 30

## 2021-08-26 MED ORDER — LIDOCAINE 2% (20 MG/ML) 5 ML SYRINGE
INTRAMUSCULAR | Status: AC
  Filled 2021-08-26: qty 5

## 2021-08-26 MED ORDER — SUCCINYLCHOLINE CHLORIDE 200 MG/10ML IV SOSY
PREFILLED_SYRINGE | INTRAVENOUS | Status: AC
  Filled 2021-08-26: qty 10

## 2021-08-26 MED ORDER — ACETAMINOPHEN 10 MG/ML IV SOLN
INTRAVENOUS | Status: AC
  Filled 2021-08-26: qty 100

## 2021-08-26 MED ORDER — CHLORHEXIDINE GLUCONATE 0.12 % MT SOLN
OROMUCOSAL | Status: AC
  Administered 2021-08-26: 15 mL via OROMUCOSAL
  Filled 2021-08-26: qty 15

## 2021-08-26 MED ORDER — ONDANSETRON HCL 4 MG/2ML IJ SOLN
INTRAMUSCULAR | Status: DC | PRN
  Administered 2021-08-26: 4 mg via INTRAVENOUS

## 2021-08-26 MED ORDER — METRONIDAZOLE 500 MG/100ML IV SOLN
500.0000 mg | Freq: Once | INTRAVENOUS | Status: AC
  Administered 2021-08-26: 500 mg via INTRAVENOUS
  Filled 2021-08-26: qty 100

## 2021-08-26 MED ORDER — CHLORHEXIDINE GLUCONATE 0.12 % MT SOLN: 15.0000 mL | Freq: Once | OROMUCOSAL | Status: AC

## 2021-08-26 MED ORDER — SODIUM CHLORIDE 0.9 % IV SOLN
2.0000 g | Freq: Once | INTRAVENOUS | Status: AC
  Administered 2021-08-26: 2 g via INTRAVENOUS
  Filled 2021-08-26: qty 20

## 2021-08-26 MED ORDER — IOHEXOL 300 MG/ML  SOLN
100.0000 mL | Freq: Once | INTRAMUSCULAR | Status: AC | PRN
  Administered 2021-08-26: 100 mL via INTRAVENOUS

## 2021-08-26 MED ORDER — FENTANYL CITRATE (PF) 100 MCG/2ML IJ SOLN
25.0000 ug | INTRAMUSCULAR | Status: DC | PRN
Start: 2021-08-26 — End: 2021-08-27
  Administered 2021-08-26 (×3): 50 ug via INTRAVENOUS

## 2021-08-26 MED ORDER — PROMETHAZINE HCL 25 MG/ML IJ SOLN
INTRAMUSCULAR | Status: AC
  Filled 2021-08-26: qty 1

## 2021-08-26 MED ORDER — FENTANYL CITRATE (PF) 100 MCG/2ML IJ SOLN
INTRAMUSCULAR | Status: DC | PRN
  Administered 2021-08-26: 100 ug via INTRAVENOUS
  Administered 2021-08-26: 50 ug via INTRAVENOUS

## 2021-08-26 MED ORDER — ACETAMINOPHEN 10 MG/ML IV SOLN
INTRAVENOUS | Status: DC | PRN
  Administered 2021-08-26: 1000 mg via INTRAVENOUS

## 2021-08-26 MED ORDER — PROPOFOL 10 MG/ML IV BOLUS
INTRAVENOUS | Status: DC | PRN
  Administered 2021-08-26: 180 mg via INTRAVENOUS

## 2021-08-26 MED ORDER — DEXMEDETOMIDINE (PRECEDEX) IN NS 20 MCG/5ML (4 MCG/ML) IV SYRINGE
PREFILLED_SYRINGE | INTRAVENOUS | Status: DC | PRN
  Administered 2021-08-26: 20 ug via INTRAVENOUS
  Administered 2021-08-26 (×2): 4 ug via INTRAVENOUS

## 2021-08-26 MED ORDER — TRAMADOL HCL 50 MG PO TABS: 50.0000 mg | ORAL_TABLET | Freq: Four times a day (QID) | ORAL | 0 refills | Status: AC | PRN

## 2021-08-26 MED ORDER — ONDANSETRON HCL 4 MG/2ML IJ SOLN
INTRAMUSCULAR | Status: AC
  Filled 2021-08-26: qty 2

## 2021-08-26 MED ORDER — LACTATED RINGERS IV SOLN: INTRAVENOUS | Status: DC

## 2021-08-26 MED ORDER — SUCCINYLCHOLINE CHLORIDE 200 MG/10ML IV SOSY
PREFILLED_SYRINGE | INTRAVENOUS | Status: DC | PRN
  Administered 2021-08-26: 100 mg via INTRAVENOUS

## 2021-08-26 MED ORDER — BUPIVACAINE-EPINEPHRINE 0.25% -1:200000 IJ SOLN
INTRAMUSCULAR | Status: DC | PRN
  Administered 2021-08-26: 23 mL

## 2021-08-26 MED ORDER — AMISULPRIDE (ANTIEMETIC) 5 MG/2ML IV SOLN
INTRAVENOUS | Status: AC
  Filled 2021-08-26: qty 4

## 2021-08-26 MED ORDER — MIDAZOLAM HCL 5 MG/5ML IJ SOLN
INTRAMUSCULAR | Status: DC | PRN
  Administered 2021-08-26: 2 mg via INTRAVENOUS

## 2021-08-26 MED ORDER — LIDOCAINE HCL (CARDIAC) PF 100 MG/5ML IV SOSY
PREFILLED_SYRINGE | INTRAVENOUS | Status: DC | PRN
  Administered 2021-08-26: 80 mg via INTRAVENOUS

## 2021-08-26 MED ORDER — AMISULPRIDE (ANTIEMETIC) 5 MG/2ML IV SOLN
10.0000 mg | Freq: Once | INTRAVENOUS | Status: AC
  Administered 2021-08-26: 10 mg via INTRAVENOUS

## 2021-08-26 MED ORDER — FENTANYL CITRATE (PF) 250 MCG/5ML IJ SOLN
INTRAMUSCULAR | Status: AC
  Filled 2021-08-26: qty 5

## 2021-08-26 MED ORDER — PROPOFOL 10 MG/ML IV BOLUS
INTRAVENOUS | Status: AC
  Filled 2021-08-26: qty 20

## 2021-08-26 SURGICAL SUPPLY — 53 items
ADH SKN CLS APL DERMABOND .7 (GAUZE/BANDAGES/DRESSINGS) ×1
APL PRP STRL LF DISP 70% ISPRP (MISCELLANEOUS) ×1
APPLIER CLIP 5 13 M/L LIGAMAX5 (MISCELLANEOUS)
APR CLP MED LRG 5 ANG JAW (MISCELLANEOUS)
BAG COUNTER SPONGE SURGICOUNT (BAG) ×2 IMPLANT
BAG SPEC RTRVL LRG 6X4 10 (ENDOMECHANICALS) ×1
BAG SPNG CNTER NS LX DISP (BAG) ×1
BAG SURGICOUNT SPONGE COUNTING (BAG) ×1
BLADE CLIPPER SURG (BLADE) ×2 IMPLANT
CANISTER SUCT 3000ML PPV (MISCELLANEOUS) ×3 IMPLANT
CHLORAPREP W/TINT 26 (MISCELLANEOUS) ×3 IMPLANT
CLIP APPLIE 5 13 M/L LIGAMAX5 (MISCELLANEOUS) IMPLANT
COVER SURGICAL LIGHT HANDLE (MISCELLANEOUS) ×3 IMPLANT
CUTTER FLEX LINEAR 45M (STAPLE) ×3 IMPLANT
DERMABOND ADVANCED (GAUZE/BANDAGES/DRESSINGS) ×2
DERMABOND ADVANCED .7 DNX12 (GAUZE/BANDAGES/DRESSINGS) ×1 IMPLANT
ELECT REM PT RETURN 9FT ADLT (ELECTROSURGICAL) ×3
ELECTRODE REM PT RTRN 9FT ADLT (ELECTROSURGICAL) ×1 IMPLANT
GAUZE 4X4 16PLY ~~LOC~~+RFID DBL (SPONGE) ×2 IMPLANT
GLOVE SURG ENC MOIS LTX SZ7.5 (GLOVE) ×3 IMPLANT
GLOVE SURG UNDER LTX SZ8 (GLOVE) ×3 IMPLANT
GOWN STRL REUS W/ TWL LRG LVL3 (GOWN DISPOSABLE) ×2 IMPLANT
GOWN STRL REUS W/ TWL XL LVL3 (GOWN DISPOSABLE) ×1 IMPLANT
GOWN STRL REUS W/TWL LRG LVL3 (GOWN DISPOSABLE) ×6
GOWN STRL REUS W/TWL XL LVL3 (GOWN DISPOSABLE) ×3
KIT BASIN OR (CUSTOM PROCEDURE TRAY) ×3 IMPLANT
KIT TURNOVER KIT B (KITS) ×3 IMPLANT
NS IRRIG 1000ML POUR BTL (IV SOLUTION) ×3 IMPLANT
PAD ARMBOARD 7.5X6 YLW CONV (MISCELLANEOUS) ×6 IMPLANT
PENCIL SMOKE EVACUATOR (MISCELLANEOUS) ×3 IMPLANT
POUCH SPECIMEN RETRIEVAL 10MM (ENDOMECHANICALS) ×3 IMPLANT
RELOAD 45 VASCULAR/THIN (ENDOMECHANICALS) IMPLANT
RELOAD STAPLE 45 2.5 WHT GRN (ENDOMECHANICALS) IMPLANT
RELOAD STAPLE 45 3.5 BLU ETS (ENDOMECHANICALS) IMPLANT
RELOAD STAPLE TA45 3.5 REG BLU (ENDOMECHANICALS) ×3 IMPLANT
SCISSORS LAP 5X35 DISP (ENDOMECHANICALS) ×2 IMPLANT
SET IRRIG TUBING LAPAROSCOPIC (IRRIGATION / IRRIGATOR) ×3 IMPLANT
SET TUBE SMOKE EVAC HIGH FLOW (TUBING) ×3 IMPLANT
SHEARS HARMONIC ACE PLUS 36CM (ENDOMECHANICALS) ×3 IMPLANT
SLEEVE ENDOPATH XCEL 5M (ENDOMECHANICALS) ×2 IMPLANT
SOL ANTI FOG 6CC (MISCELLANEOUS) IMPLANT
SOLUTION ANTI FOG 6CC (MISCELLANEOUS) ×2
SPECIMEN JAR SMALL (MISCELLANEOUS) ×3 IMPLANT
SUT MNCRL AB 4-0 PS2 18 (SUTURE) ×3 IMPLANT
TOWEL GREEN STERILE (TOWEL DISPOSABLE) ×3 IMPLANT
TOWEL GREEN STERILE FF (TOWEL DISPOSABLE) ×3 IMPLANT
TRAY FOLEY W/BAG SLVR 16FR (SET/KITS/TRAYS/PACK) ×3
TRAY FOLEY W/BAG SLVR 16FR ST (SET/KITS/TRAYS/PACK) ×1 IMPLANT
TRAY LAPAROSCOPIC MC (CUSTOM PROCEDURE TRAY) ×3 IMPLANT
TROCAR ADV FIXATION 5X100MM (TROCAR) ×6 IMPLANT
TROCAR XCEL BLUNT TIP 100MML (ENDOMECHANICALS) ×3 IMPLANT
TROCAR XCEL NON-BLD 5MMX100MML (ENDOMECHANICALS) ×2 IMPLANT
WATER STERILE IRR 1000ML POUR (IV SOLUTION) ×3 IMPLANT

## 2021-08-26 NOTE — H&P (Signed)
CC: Acute appendicitis  Requesting provider: Delia Heady PAC  HPI: Joshua Preston is an 20 y.o. male with no known medical history per se presented to the emergency department with acute onset of right lower quadrant abdominal pain.  It woke him from sleep.  The pain has persisted.  The pain is described as being sharp.  It will radiate across the lower abdomen to around his suprapubic midline.  Nothing seems to make it better or worse he has never had pain quite like this or this severe before.  He denies any associated nausea, vomiting, or diarrhea.  He denies any blood in his stool.  Past Medical History:  Diagnosis Date   Anxiety    Attention deficit    Depression    Loose body of right elbow     Past Surgical History:  Procedure Laterality Date   ELBOW ARTHROSCOPY Right 07/28/2020   Procedure: ARTHROSCOPY RIGHT ELBOW WITH DEBRIDEMENT AND LOOSE BODY EXCISION;  Surgeon: Elsie Saas, MD;  Location: [facility];  Service: Orthopedics;  Laterality: Right;    Family History  Problem Relation Age of Onset   ADD / ADHD Mother    ADD / ADHD Sister    Heart disease Maternal Grandmother    Hypertension Maternal Grandmother    Depression Maternal Grandfather    Alcohol abuse Maternal Grandfather    Arthritis Neg Hx    Asthma Neg Hx    Birth defects Neg Hx    Cancer Neg Hx    COPD Neg Hx    Diabetes Neg Hx    Drug abuse Neg Hx    Early death Neg Hx    Hearing loss Neg Hx    Hyperlipidemia Neg Hx    Kidney disease Neg Hx    Varicose Veins Neg Hx    Vision loss Neg Hx    Stroke Neg Hx    Miscarriages / Stillbirths Neg Hx    Mental retardation Neg Hx    Mental illness Neg Hx    Learning disabilities Neg Hx     Social:  reports that he has been smoking e-cigarettes. He has never used smokeless tobacco. He reports current alcohol use. He reports that he does not use drugs.  Allergies: No Known Allergies  Medications: I have reviewed the patient's  current medications.  Results for orders placed or performed during the hospital encounter of 08/26/21 (from the past 48 hour(s))  Comprehensive metabolic panel     Status: Abnormal   Collection Time: 08/26/21 11:28 AM  Result Value Ref Range   Sodium 139 135 - 145 mmol/L   Potassium 4.2 3.5 - 5.1 mmol/L   Chloride 102 98 - 111 mmol/L   CO2 28 22 - 32 mmol/L   Glucose, Bld 137 (H) 70 - 99 mg/dL    Comment: Glucose reference range applies only to samples taken after fasting for at least 8 hours.   BUN 12 6 - 20 mg/dL   Creatinine, Ser 1.09 0.61 - 1.24 mg/dL   Calcium 9.7 8.9 - 10.3 mg/dL   Total Protein 6.9 6.5 - 8.1 g/dL   Albumin 4.4 3.5 - 5.0 g/dL   AST 30 15 - 41 U/L   ALT 40 0 - 44 U/L   Alkaline Phosphatase 59 38 - 126 U/L   Total Bilirubin 0.9 0.3 - 1.2 mg/dL   GFR, Estimated >60 >60 mL/min    Comment: (NOTE) Calculated using the CKD-EPI Creatinine Equation (2021)  Anion gap 9 5 - 15    Comment: Performed at [facility] [address], [place], [place] [address]  CBC with Differential     Status: Abnormal   Collection Time: 08/26/21 11:28 AM  Result Value Ref Range   WBC 9.8 4.0 - 10.5 K/uL   RBC 4.72 4.22 - 5.81 MIL/uL   Hemoglobin 14.3 13.0 - 17.0 g/dL   HCT 43.8 39.0 - 52.0 %   MCV 92.8 80.0 - 100.0 fL   MCH 30.3 26.0 - 34.0 pg   MCHC 32.6 30.0 - 36.0 g/dL   RDW 12.7 11.5 - 15.5 %   Platelets 185 150 - 400 K/uL   nRBC 0.0 0.0 - 0.2 %   Neutrophils Relative % 46 %   Neutro Abs 4.5 1.7 - 7.7 K/uL   Lymphocytes Relative 42 %   Lymphs Abs 4.1 (H) 0.7 - 4.0 K/uL   Monocytes Relative 10 %   Monocytes Absolute 1.0 0.1 - 1.0 K/uL   Eosinophils Relative 0 %   Eosinophils Absolute 0.0 0.0 - 0.5 K/uL   Basophils Relative 2 %   Basophils Absolute 0.2 (H) 0.0 - 0.1 K/uL   nRBC 0 0 /100 WBC   Abs Immature Granulocytes 0.00 0.00 - 0.07 K/uL    Comment: Performed at [facility] [address], [place], [place] [address]  Lipase, blood     Status: None    Collection Time: 08/26/21 11:28 AM  Result Value Ref Range   Lipase 32 11 - 51 U/L    Comment: Performed at [facility] [address], [place], [place] [address]  CK     Status: None   Collection Time: 08/26/21 11:28 AM  Result Value Ref Range   Total CK 183 49 - 397 U/L    Comment: Performed at [facility], [place] [address], [place], [place] [address]  Urinalysis, Routine w reflex microscopic     Status: None   Collection Time: 08/26/21 11:29 AM  Result Value Ref Range   Color, Urine YELLOW YELLOW   APPearance CLEAR CLEAR   Specific Gravity, Urine 1.010 1.005 - 1.030   pH 6.0 5.0 - 8.0   Glucose, UA NEGATIVE NEGATIVE mg/dL   Hgb urine dipstick NEGATIVE NEGATIVE   Bilirubin Urine NEGATIVE NEGATIVE   Ketones, ur NEGATIVE NEGATIVE mg/dL   Protein, ur NEGATIVE NEGATIVE mg/dL   Nitrite NEGATIVE NEGATIVE   Leukocytes,Ua NEGATIVE NEGATIVE    Comment: Microscopic not done on urines with negative protein, blood, leukocytes, nitrite, or glucose < 500 mg/dL. Performed at [facility], [place] [address] [place], [place] [address]   Resp Panel by RT-PCR (Flu A&B, Covid) Nasopharyngeal Swab     Status: None   Collection Time: 08/26/21  5:19 PM   Specimen: Nasopharyngeal Swab; Nasopharyngeal(NP) swabs in vial transport medium  Result Value Ref Range   SARS Coronavirus 2 by RT PCR NEGATIVE NEGATIVE    Comment: (NOTE) SARS-CoV-2 target nucleic acids are NOT DETECTED.  The SARS-CoV-2 RNA is generally detectable in upper respiratory specimens during the acute phase of infection. The lowest concentration of SARS-CoV-2 viral copies this assay can detect is 138 copies/mL. A negative result does not preclude SARS-Cov-2 infection and should not be used as the sole basis for treatment or other patient management decisions. A negative result may occur with  improper specimen collection/handling, submission of specimen other than nasopharyngeal swab, presence of viral mutation(s)  within the  areas targeted by this assay, and inadequate number of viral copies(<138 copies/mL). A negative result must be combined with clinical observations, patient history, and epidemiological information. The expected result is Negative.  Fact Sheet for Patients:  BloggerCourse.com  Fact Sheet for Healthcare Providers:  SeriousBroker.it  This test is no t yet approved or cleared by the [place] FDA and  has been authorized for detection and/or diagnosis of SARS-CoV-2 by FDA under an Emergency Use Authorization (EUA). This EUA will remain  in effect (meaning this test can be used) for the duration of the COVID-19 declaration under Section 564(b)(1) of the Act, 21 U.S.C.section 360bbb-3(b)(1), unless the authorization is terminated  or revoked sooner.       Influenza A by PCR NEGATIVE NEGATIVE   Influenza B by PCR NEGATIVE NEGATIVE    Comment: (NOTE) The Xpert Xpress SARS-CoV-2/FLU/RSV plus assay is intended as an aid in the diagnosis of influenza from Nasopharyngeal swab specimens and should not be used as a sole basis for treatment. Nasal washings and aspirates are unacceptable for Xpert Xpress SARS-CoV-2/FLU/RSV testing.  Fact Sheet for Patients: BloggerCourse.com  Fact Sheet for Healthcare Providers: SeriousBroker.it  This test is not yet approved or cleared by the [place] FDA and has been authorized for detection and/or diagnosis of SARS-CoV-2 by FDA under an Emergency Use Authorization (EUA). This EUA will remain in effect (meaning this test can be used) for the duration of the COVID-19 declaration under Section 564(b)(1) of the Act, 21 U.S.C. section 360bbb-3(b)(1), unless the authorization is terminated or revoked.  Performed at [facility], [address] [place], [place] [address]     CT ABDOMEN PELVIS W CONTRAST  Result Date:  08/26/2021 CLINICAL DATA:  Right lower quadrant abdominal pain. EXAM: CT ABDOMEN AND PELVIS WITH CONTRAST TECHNIQUE: Multidetector CT imaging of the abdomen and pelvis was performed using the standard protocol following bolus administration of intravenous contrast. CONTRAST:  OMNIPAQUE IOHEXOL 300 MG/ML  SOLN COMPARISON:  CT chest abdomen and pelvis 12/30/2018. FINDINGS: Lower chest: No acute abnormality. Hepatobiliary: No focal liver abnormality is seen. No gallstones, gallbladder wall thickening, or biliary dilatation. Pancreas: Unremarkable. No pancreatic ductal dilatation or surrounding inflammatory changes. Spleen: Normal in size without focal abnormality. Adrenals/Urinary Tract: Adrenal glands are unremarkable. Kidneys are normal, without renal calculi, focal lesion, or hydronephrosis. Bladder is unremarkable. Stomach/Bowel: The appendix is dilated measuring up to 1 cm in thickness. Appendicoliths are present. There is mild surrounding inflammatory stranding. No perforation or abscess. Otherwise, colon, small bowel and stomach are within normal limits. Vascular/Lymphatic: No significant vascular findings are present. No enlarged abdominal or pelvic lymph nodes. Reproductive: Prostate is unremarkable. Other: No abdominal wall hernia or abnormality. No abdominopelvic ascites. Musculoskeletal: No fracture is seen. IMPRESSION: Enlarged appendix with mild surrounding inflammation compatible with acute appendicitis. No evidence for perforation or abscess. Electronically Signed   By: Darliss Cheney M.D.   On: 08/26/2021 17:10    ROS - all of the below systems have been reviewed with the patient and positives are indicated with bold text General: chills, fever or night sweats Eyes: blurry vision or double vision ENT: epistaxis or sore throat Allergy/Immunology: itchy/watery eyes or nasal congestion Hematologic/Lymphatic: bleeding problems, blood clots or swollen lymph nodes Endocrine: temperature  intolerance or unexpected weight changes Breast: new or changing breast lumps or nipple discharge Resp: cough, shortness of breath, or wheezing CV: chest pain or dyspnea on exertion GI: as per HPI GU: dysuria, trouble voiding, or hematuria MSK:  joint pain or joint stiffness Neuro: TIA or stroke symptoms Derm: pruritus and skin lesion changes Psych: anxiety and depression  PE Blood pressure 118/71, pulse 67, temperature 98.4 F (36.9 C), resp. rate 20, SpO2 100 %. Constitutional: NAD; conversant; no deformities Eyes: Moist conjunctiva; no lid lag; anicteric; PERRL Neck: Trachea midline; no thyromegaly Lungs: Normal respiratory effort; no tactile fremitus CV: RRR; no palpable thrills; no pitting edema GI: Abd soft, focally ttp in RLQ; nondistended; no palpable hepatosplenomegaly MSK: Normal range of motion of extremities; no clubbing/cyanosis Psychiatric: Appropriate affect; alert and oriented x3 Lymphatic: No palpable cervical or axillary lymphadenopathy  Results for orders placed or performed during the hospital encounter of 08/26/21 (from the past 48 hour(s))  Comprehensive metabolic panel     Status: Abnormal   Collection Time: 08/26/21 11:28 AM  Result Value Ref Range   Sodium 139 135 - 145 mmol/L   Potassium 4.2 3.5 - 5.1 mmol/L   Chloride 102 98 - 111 mmol/L   CO2 28 22 - 32 mmol/L   Glucose, Bld 137 (H) 70 - 99 mg/dL    Comment: Glucose reference range applies only to samples taken after fasting for at least 8 hours.   BUN 12 6 - 20 mg/dL   Creatinine, Ser 1.09 0.61 - 1.24 mg/dL   Calcium 9.7 8.9 - 10.3 mg/dL   Total Protein 6.9 6.5 - 8.1 g/dL   Albumin 4.4 3.5 - 5.0 g/dL   AST 30 15 - 41 U/L   ALT 40 0 - 44 U/L   Alkaline Phosphatase 59 38 - 126 U/L   Total Bilirubin 0.9 0.3 - 1.2 mg/dL   GFR, Estimated >60 >60 mL/min    Comment: (NOTE) Calculated using the CKD-EPI Creatinine Equation (2021)    Anion gap 9 5 - 15    Comment: Performed at [facility] [address] [place], [place] [address]  CBC with Differential     Status: Abnormal   Collection Time: 08/26/21 11:28 AM  Result Value Ref Range   WBC 9.8 4.0 - 10.5 K/uL   RBC 4.72 4.22 - 5.81 MIL/uL   Hemoglobin 14.3 13.0 - 17.0 g/dL   HCT 43.8 39.0 - 52.0 %   MCV 92.8 80.0 - 100.0 fL   MCH 30.3 26.0 - 34.0 pg   MCHC 32.6 30.0 - 36.0 g/dL   RDW 12.7 11.5 - 15.5 %   Platelets 185 150 - 400 K/uL   nRBC 0.0 0.0 - 0.2 %   Neutrophils Relative % 46 %   Neutro Abs 4.5 1.7 - 7.7 K/uL   Lymphocytes Relative 42 %   Lymphs Abs 4.1 (H) 0.7 - 4.0 K/uL   Monocytes Relative 10 %   Monocytes Absolute 1.0 0.1 - 1.0 K/uL   Eosinophils Relative 0 %   Eosinophils Absolute 0.0 0.0 - 0.5 K/uL   Basophils Relative 2 %   Basophils Absolute 0.2 (H) 0.0 - 0.1 K/uL   nRBC 0 0 /100 WBC   Abs Immature Granulocytes 0.00 0.00 - 0.07 K/uL    Comment: Performed at [facility] [address], [place], [place] [address]  Lipase, blood     Status: None   Collection Time: 08/26/21 11:28 AM  Result Value Ref Range   Lipase 32 11 - 51 U/L    Comment: Performed at [facility] [address], [place],  [address]  CK     Status: None   Collection Time:  08/26/21 11:28 AM  Result Value Ref Range   Total CK 183 49 - 397 U/L    Comment: Performed at [facility], [place] [address] [place], [place] [address]  Urinalysis, Routine w reflex microscopic     Status: None   Collection Time: 08/26/21 11:29 AM  Result Value Ref Range   Color, Urine YELLOW YELLOW   APPearance CLEAR CLEAR   Specific Gravity, Urine 1.010 1.005 - 1.030   pH 6.0 5.0 - 8.0   Glucose, UA NEGATIVE NEGATIVE mg/dL   Hgb urine dipstick NEGATIVE NEGATIVE   Bilirubin Urine NEGATIVE NEGATIVE   Ketones, ur NEGATIVE NEGATIVE mg/dL   Protein, ur NEGATIVE NEGATIVE mg/dL   Nitrite NEGATIVE NEGATIVE   Leukocytes,Ua NEGATIVE NEGATIVE    Comment: Microscopic not done on urines with negative protein, blood, leukocytes, nitrite, or  glucose < 500 mg/dL. Performed at [facility], [place] [address], [place], [place] [address]   Resp Panel by RT-PCR (Flu A&B, Covid) Nasopharyngeal Swab     Status: None   Collection Time: 08/26/21  5:19 PM   Specimen: Nasopharyngeal Swab; Nasopharyngeal(NP) swabs in vial transport medium  Result Value Ref Range   SARS Coronavirus 2 by RT PCR NEGATIVE NEGATIVE    Comment: (NOTE) SARS-CoV-2 target nucleic acids are NOT DETECTED.  The SARS-CoV-2 RNA is generally detectable in upper respiratory specimens during the acute phase of infection. The lowest concentration of SARS-CoV-2 viral copies this assay can detect is 138 copies/mL. A negative result does not preclude SARS-Cov-2 infection and should not be used as the sole basis for treatment or other patient management decisions. A negative result may occur with  improper specimen collection/handling, submission of specimen other than nasopharyngeal swab, presence of viral mutation(s) within the areas targeted by this assay, and inadequate number of viral copies(<138 copies/mL). A negative result must be combined with clinical observations, patient history, and epidemiological information. The expected result is Negative.  Fact Sheet for Patients:  EntrepreneurPulse.com.au  Fact Sheet for Healthcare Providers:  IncredibleEmployment.be  This test is no t yet approved or cleared by the [place] FDA and  has been authorized for detection and/or diagnosis of SARS-CoV-2 by FDA under an Emergency Use Authorization (EUA). This EUA will remain  in effect (meaning this test can be used) for the duration of the COVID-19 declaration under Section 564(b)(1) of the Act, 21 U.S.C.section 360bbb-3(b)(1), unless the authorization is terminated  or revoked sooner.       Influenza A by PCR NEGATIVE NEGATIVE   Influenza B by PCR NEGATIVE NEGATIVE    Comment: (NOTE) The Xpert Xpress SARS-CoV-2/FLU/RSV  plus assay is intended as an aid in the diagnosis of influenza from Nasopharyngeal swab specimens and should not be used as a sole basis for treatment. Nasal washings and aspirates are unacceptable for Xpert Xpress SARS-CoV-2/FLU/RSV testing.  Fact Sheet for Patients: EntrepreneurPulse.com.au  Fact Sheet for Healthcare Providers: IncredibleEmployment.be  This test is not yet approved or cleared by the [place] FDA and has been authorized for detection and/or diagnosis of SARS-CoV-2 by FDA under an Emergency Use Authorization (EUA). This EUA will remain in effect (meaning this test can be used) for the duration of the COVID-19 declaration under Section 564(b)(1) of the Act, 21 U.S.C. section 360bbb-3(b)(1), unless the authorization is terminated or revoked.  Performed at [facility], [place] [address], [place], [place] [address]     CT ABDOMEN PELVIS W CONTRAST  Result Date: 08/26/2021 CLINICAL DATA:  Right lower quadrant abdominal pain. EXAM: CT ABDOMEN AND PELVIS WITH CONTRAST TECHNIQUE: Multidetector CT imaging of the abdomen and pelvis was performed using the standard protocol following bolus administration of intravenous contrast. CONTRAST:  125mL OMNIPAQUE IOHEXOL 300 MG/ML  SOLN COMPARISON:  CT chest abdomen and pelvis 12/30/2018. FINDINGS: Lower chest: No acute abnormality. Hepatobiliary: No focal liver abnormality is seen. No gallstones, gallbladder wall thickening, or biliary dilatation. Pancreas: Unremarkable. No pancreatic ductal dilatation or surrounding inflammatory changes. Spleen: Normal in size without focal abnormality. Adrenals/Urinary Tract: Adrenal glands are unremarkable. Kidneys are normal, without renal calculi, focal lesion, or hydronephrosis. Bladder is unremarkable. Stomach/Bowel: The appendix is dilated measuring up to 1 cm in thickness. Appendicoliths are present. There is mild surrounding inflammatory stranding. No  perforation or abscess. Otherwise, colon, small bowel and stomach are within normal limits. Vascular/Lymphatic: No significant vascular findings are present. No enlarged abdominal or pelvic lymph nodes. Reproductive: Prostate is unremarkable. Other: No abdominal wall hernia or abnormality. No abdominopelvic ascites. Musculoskeletal: No fracture is seen. IMPRESSION: Enlarged appendix with mild surrounding inflammation compatible with acute appendicitis. No evidence for perforation or abscess. Electronically Signed   By: Ronney Asters M.D.   On: 08/26/2021 17:10     A/P: Joshua Preston is an 20 y.o. male with acute appendicitis without evident perforation or abscess formation on CT scan  -I have personally reviewed his CT -he does have an apparent dilated appendix to 10 mm that is apparently fluid-filled.  There are appendicoliths.  There is also some associated mild stranding.  No evident perforation or abscess. -The anatomy and physiology of the GI tract was discussed with him. The pathophysiology of appendicitis was discussed as well with him and his mother. -We reviewed options moving forward for treatment, covering IV abx vs surgery. We discussed that with antibiotics alone, there is reasonable success in managing appendicitis, however, risks of recurrence at 5 yrs being as high as 40% in some studies. We discussed appendectomy - laparoscopic and potential open techniques as well as scenarios where an ileocecectomy could be necessary. We discussed the material risks (including, but not limited to, pain, bleeding, infection, scarring, need for blood transfusion, damage to surrounding structures- blood vessels/nerves/viscus/organs, damage to ureter/bladder, leak from staple line, need for additional procedures, hernia, recurrence although quite low, pneumonia, heart attack, stroke, death) benefits and alternatives to surgery were discussed. [name] questions were answered to his satisfaction, he  voiced understanding and elected to proceed with surgery. Additionally, we discussed typical postoperative expectations and the recovery process. -We have also discussed activity restrictions following surgery including no heavy lifting greater than 15 pounds for the first 4-6 weeks after surgery.  Nadeen Landau, MD [facility] Use [email or web address] to contact on call provider

## 2021-08-26 NOTE — Op Note (Signed)
Joshua Preston [id number]   PRE-OPERATIVE DIAGNOSIS:  Acute appendicitis  POST-OPERATIVE DIAGNOSIS:  Acute appendicitis without evident perforation or abscess  PROCEDURE: Laparoscopic appendectomy  SURGEON:  Stephanie Coup. Lauran Romanski, M.D.  ASSISTANT: OR staff  ANESTHESIA: General endotracheal  EBL:   5 mL  DRAINS: None  SPECIMEN:  Appendix  COUNTS:  Sponge, needle and instrument counts were reported correct x2 at conclusion of the operation  DISPOSITION:  PACU in satisfactory condition  COMPLICATIONS: None  FINDINGS: Acutely inflamed appearing appendix without perforation or abscess.  The appendix may have perforated within the specimen retrieval bag but there was no spillage during extraction or prior to placing in retrieval bag.  DESCRIPTION:   The patient was identified & brought into the operating room. SCDs were in place and functioning. General endotracheal anesthesia was administered. Preoperative antibiotics were administered. The patient was positioned supine with left arm tucked. Hair on the abdomen was then clipped by the OR team. A foley catheter was inserted under sterile conditions. The abdomen was prepped and draped in the standard sterile fashion. A surgical timeout confirmed our plan.  A small incision was made in the infraumbilical fold. The subcutaneous tissue was dissected and the umbilical stalk identified. The stalk was grasped with a Kocher and retracted outwardly. The infraumbilical fascia was exposed and incised. Peritoneal entry was carefully made bluntly. A 0 Vicryl purse-string suture was placed and then the [facility] port was introduced into the abdomen.  CO2 insufflation commenced to . The laparoscope was inserted and confirmed no evidence of trocar site complications. The patient was then positioned in Trendelenburg. Two additional ports were placed - one in left lower quadrant and another in the suprapubic midline taking care to stay well above the  bladder - 3 fingerbreadths above the pubic symphysis. The bed was then slightly tilted to place the left side down.  The appendix is readily identified in the right lower quadrant in the intraperitoneal position.  There is a single adhesive band tenting to the right abdominal wall that is released with cautery.  The appendix is acutely inflamed in appearance, edematous, and somewhat firm.  There is no evident perforation or abscess. The base of the appendix was circumferentially dissected taking care to preserve the cecum free of injury. The base was noted to be viable and healthy appearing. The terminal ileum, cecum and ascending colon also appeared normal. The base of the appendix was then stapled with a blue load, taking a small healthy cuff of viable cecum, taking care to stay clear of the ileocecal valve. The mesoappendix was then ligated using the harmonic scalpel. The mesoappendix was inspected and noted to be hemostatic. The appendix was placed in an EndoBag.  The right lower quadrant was conservatively irrigated. Hemostasis was noted to be achieved - taking time to inspect the ligated mesoappendix, colon mesentery, and retroperitoneum. Staple line was noted to be intact on the cecum with no bleeding. There was no perforation or injury. The right lower quadrant appeared clean and as such, no drain was placed.  The left lower quadrant and suprapubic ports were removed under direct visualization. The EndoBag was then removed through the umbilical port site and passed off as specimen.  Of note, during extraction, there was serous appearing fluid within the Endo Catch bag. That said, there is no spillage during extraction.  The CO2 was exhausted from the abdomen. The umbilical fascia was then closed by closing the 0 Vicryl suture. The fascia was palpated and noted to  be completely closed. The skin of all port sites was then approximated using 4-0 Monocryl suture. The incisions were covered with  Dermabond.  He was then awakened from general anesthesia, extubated, and transferred to a stretcher for transport to recover in satisfactory condition.

## 2021-08-26 NOTE — ED Notes (Signed)
Patient transported to CT 

## 2021-08-26 NOTE — Discharge Instructions (Signed)
POST OP INSTRUCTIONS  DIET: As tolerated. Follow a light bland diet the first 24 hours after arrival home, such as soup, liquids, crackers, etc.  Be sure to include lots of fluids daily.  Avoid fast food or heavy meals as your are more likely to get nauseated.  Eat a low fat the next few days after surgery.  Take your usually prescribed home medications unless otherwise directed.  PAIN CONTROL: Pain is best controlled by a usual combination of three different methods TOGETHER: Ice/Heat Over the counter pain medication Prescription pain medication Most patients will experience some swelling and bruising around the surgical site.  Ice packs or heating pads (30-60 minutes up to 6 times a day) will help. Some people prefer to use ice alone, heat alone, alternating between ice & heat.  Experiment to what works for you.  Swelling and bruising can take several weeks to resolve.   It is helpful to take an over-the-counter pain medication regularly for the first few weeks: Ibuprofen (Motrin/Advil) - 200mg tabs - take 3 tabs (600mg) every 6 hours as needed for pain Acetaminophen (Tylenol) - you may take 650mg every 6 hours as needed. You can take this with motrin as they act differently on the body. If you are taking a narcotic pain medication that has acetaminophen in it, do not take over the counter tylenol at the same time.  Iii. NOTE: You may take both of these medications together - most patients  find it most helpful when alternating between the two (i.e. Ibuprofen at 6am,  tylenol at 9am, ibuprofen at 12pm ...) A  prescription for pain medication should be given to you upon discharge.  Take your pain medication as prescribed if your pain is not adequatly controlled with the over-the-counter pain reliefs mentioned above.  Avoid getting constipated.  Between the surgery and the pain medications, it is common to experience some constipation.  Increasing fluid intake and taking a fiber supplement (such as  Metamucil, Citrucel, FiberCon, MiraLax, etc) 1-2 times a day regularly will usually help prevent this problem from occurring.  A mild laxative (prune juice, Milk of Magnesia, MiraLax, etc) should be taken according to package directions if there are no bowel movements after 48 hours.    Dressing: Your incisions are covered in Dermabond which is like sterile superglue for the skin. This will come off on it's own in a couple weeks. It is waterproof and you may bathe normally starting the day after your surgery in a shower. Avoid baths/pools/lakes/oceans until your wounds have fully healed.  ACTIVITIES as tolerated:   Avoid heavy lifting (>10lbs or 1 gallon of milk) for the next 6 weeks. You may resume regular (light) daily activities beginning the next day--such as daily self-care, walking, climbing stairs--gradually increasing activities as tolerated.  If you can walk 30 minutes without difficulty, it is safe to try more intense activity such as jogging, treadmill, bicycling, low-impact aerobics.  DO NOT PUSH THROUGH PAIN.  Let pain be your guide: If it hurts to do something, don't do it. You may drive when you are no longer taking prescription pain medication, you can comfortably wear a seatbelt, and you can safely maneuver your car and apply brakes.   FOLLOW UP in our office Please call [facility] at [phone] to set up an appointment to see your surgeon in the office for a follow-up appointment approximately 2 weeks after your surgery. Make sure that you call for this appointment the day you arrive home to   insure a convenient appointment time.  9. If you have disability or family leave forms that need to be completed, you may have them completed by your primary care physician's office; for return to work instructions, please ask our office staff and they will be happy to assist you in obtaining this documentation   When to call us [phone]: Poor pain control Reactions / problems with new  medications (rash/itching, etc)  Fever over 101.5 F (38.5 C) Inability to urinate Nausea/vomiting Worsening swelling or bruising Continued bleeding from incision. Increased pain, redness, or drainage from the incision  The clinic staff is available to answer your questions during regular business hours (8:30am-5pm).  Please don't hesitate to call and ask to speak to one of our nurses for clinical concerns.   A surgeon from [facility] is always on call at the hospitals   If you have a medical emergency, go to the nearest emergency room or call 911.  [facility] [address], [address], [place], [place]  [address] MAIN: [phone] FAX: [phone] [email or web address]  

## 2021-08-26 NOTE — Anesthesia Procedure Notes (Signed)
Procedure Name: Intubation Date/Time: 08/26/2021 7:46 PM Performed by: Leyland Kenna T, CRNA Pre-anesthesia Checklist: Patient identified, Emergency Drugs available, Suction available and Patient being monitored Patient Re-evaluated:Patient Re-evaluated prior to induction Oxygen Delivery Method: Circle system utilized Preoxygenation: Pre-oxygenation with 100% oxygen Induction Type: IV induction, Rapid sequence and Cricoid Pressure applied Ventilation: Mask ventilation without difficulty Laryngoscope Size: Miller and 3 Tube type: Oral Tube size: 7.5 mm Number of attempts: 1 Airway Equipment and Method: Stylet and Oral airway Placement Confirmation: ETT inserted through vocal cords under direct vision, positive ETCO2 and breath sounds checked- equal and bilateral Secured at: 22 cm Tube secured with: Tape Dental Injury: Teeth and Oropharynx as per pre-operative assessment

## 2021-08-26 NOTE — Anesthesia Preprocedure Evaluation (Addendum)
Anesthesia Evaluation  Patient identified by MRN, date of birth, ID band Patient awake    Reviewed: Allergy & Precautions, NPO status , Patient's Chart, lab work & pertinent test results  Airway Mallampati: II  TM Distance: >3 FB Neck ROM: Full    Dental  (+) Teeth Intact, Dental Advisory Given, Chipped,    Pulmonary Current Smoker,    Pulmonary exam normal breath sounds clear to auscultation       Cardiovascular negative cardio ROS Normal cardiovascular exam Rhythm:Regular Rate:Normal     Neuro/Psych PSYCHIATRIC DISORDERS Anxiety Depression negative neurological ROS     GI/Hepatic Neg liver ROS, acute appendicitis   Endo/Other  negative endocrine ROS  Renal/GU negative Renal ROS     Musculoskeletal negative musculoskeletal ROS (+)   Abdominal   Peds  (+) ATTENTION DEFICIT DISORDER WITHOUT HYPERACTIVITY Hematology negative hematology ROS (+)   Anesthesia Other Findings Day of surgery medications reviewed with the patient.  Reproductive/Obstetrics                            Anesthesia Physical Anesthesia Plan  ASA: 2 and emergent  Anesthesia Plan: General   Post-op Pain Management:    Induction: Intravenous, Cricoid pressure planned and Rapid sequence  PONV Risk Score and Plan: 3 and Midazolam, Dexamethasone and Ondansetron  Airway Management Planned: Oral ETT  Additional Equipment:   Intra-op Plan:   Post-operative Plan: Extubation in OR  Informed Consent: I have reviewed the patients History and Physical, chart, labs and discussed the procedure including the risks, benefits and alternatives for the proposed anesthesia with the patient or authorized representative who has indicated his/her understanding and acceptance.     Dental advisory given  Plan Discussed with: CRNA  Anesthesia Plan Comments:         Anesthesia Quick Evaluation

## 2021-08-26 NOTE — ED Triage Notes (Signed)
Pt. Stated, I was woke up with pain to the right sid eof my stomach. I went to [facility]  and they sent me here. Might be my appendix.

## 2021-08-26 NOTE — Anesthesia Postprocedure Evaluation (Signed)
Anesthesia Post Note  Patient: Joshua Preston  Procedure(s) Performed: APPENDECTOMY LAPAROSCOPIC     Patient location during evaluation: PACU Anesthesia Type: General Level of consciousness: awake and alert Pain management: pain level controlled Vital Signs Assessment: post-procedure vital signs reviewed and stable Respiratory status: spontaneous breathing, nonlabored ventilation and respiratory function stable Cardiovascular status: blood pressure returned to baseline and stable Postop Assessment: no apparent nausea or vomiting Anesthetic complications: no   No notable events documented.  Last Vitals:  Vitals:   08/26/21 2200 08/26/21 2215  BP: 119/63 120/70  Pulse: 65 (!) 58  Resp: 19 18  Temp:    SpO2: 98% 100%    Last Pain:  Vitals:   08/26/21 2215  TempSrc:   PainSc: Asleep                 Cecile Hearing

## 2021-08-26 NOTE — Transfer of Care (Signed)
Immediate Anesthesia Transfer of Care Note  Patient: Joshua Preston  Procedure(s) Performed: APPENDECTOMY LAPAROSCOPIC  Patient Location: PACU  Anesthesia Type:General  Level of Consciousness: drowsy  Airway & Oxygen Therapy: Patient Spontanous Breathing and Patient connected to nasal cannula oxygen  Post-op Assessment: Report given to RN, Post -op Vital signs reviewed and stable and Patient moving all extremities  Post vital signs: Reviewed and stable  Last Vitals:  Vitals Value Taken Time  BP 111/63 08/26/21 2115  Temp 36.4 C 08/26/21 2115  Pulse 66 08/26/21 2115  Resp 18 08/26/21 2115  SpO2 100 % 08/26/21 2115  Vitals shown include unvalidated device data.  Last Pain:  Vitals:   08/26/21 1915  TempSrc: Oral  PainSc: 0-No pain         Complications: No notable events documented.

## 2021-08-26 NOTE — ED Provider Notes (Signed)
Emergency Medicine Provider Triage Evaluation Note  Joshua Preston , a 20 y.o. male  was evaluated in triage.  Pt complains of right lower quadrant pain since waking up.  Reports "feeling hot" when he urinates but states that this could have been when he is dehydrated from working out a lot and not drinking enough water.  Sent from urgent care for imaging.  Denies any nausea, vomiting or changes to bowel movements.  No prior abdominal surgeries  Review of Systems  Positive: Abdominal pain Negative: Vomiting, diarrhea  Physical Exam  BP 114/74 (BP Location: Right Arm)   Pulse 73   Temp 99 F (37.2 C) (Oral)   Resp 17   SpO2 100%  Gen:   Awake, no distress   Resp:  Normal effort  MSK:   Moves extremities without difficulty  Other:  Abdomen is soft denies any pain with palpation but does report pain otherwise  Medical Decision Making  Medically screening exam initiated at 11:28 AM.  Appropriate orders placed.  STEVIN BIELINSKI was informed that the remainder of the evaluation will be completed by another provider, this initial triage assessment does not replace that evaluation, and the importance of remaining in the ED until their evaluation is complete.  Work-up initiated including CT scan   Dietrich Pates, PA-C 08/26/21 1131    Tanda Rockers A, DO 08/26/21 1645

## 2021-08-26 NOTE — ED Provider Notes (Signed)
[facility] EMERGENCY DEPARTMENT Provider Note   CSN: [id number] Arrival date & time: 08/26/21  0945     History Chief Complaint  Patient presents with   Abdominal Pain    Joshua Preston is a 20 y.o. male with a past medical history of anxiety presenting to the ED with a chief complaint of right lower quadrant pain.  Symptoms woke him up from his sleep this morning.  Reports sharp pain that is worse with movements.  Reports there is pressure associated with urination.  Pain will intermittently radiate to his suprapubic area.  Sent from urgent care to rule out appendicitis.  Has not take any medications for his symptoms.  No nausea, vomiting, changes to bowel movements or fever.  No prior abdominal surgeries   Abdominal Pain Associated symptoms: no chest pain, no chills, no constipation, no cough, no diarrhea, no dysuria, no fever, no hematuria, no nausea, no shortness of breath, no sore throat and no vomiting       Past Medical History:  Diagnosis Date   Anxiety    Attention deficit    Depression    Loose body of right elbow     Patient Active Problem List   Diagnosis Date Noted   Lower thoracic back pain 11/24/2020   Encounter for routine child health examination without abnormal findings 11/24/2020   Primary hypertension 11/24/2020    Past Surgical History:  Procedure Laterality Date   ELBOW ARTHROSCOPY Right 07/28/2020   Procedure: ARTHROSCOPY RIGHT ELBOW WITH DEBRIDEMENT AND LOOSE BODY EXCISION;  Surgeon: Elsie Saas, MD;  Location: [facility];  Service: Orthopedics;  Laterality: Right;       Family History  Problem Relation Age of Onset   ADD / ADHD Mother    ADD / ADHD Sister    Heart disease Maternal Grandmother    Hypertension Maternal Grandmother    Depression Maternal Grandfather    Alcohol abuse Maternal Grandfather    Arthritis Neg Hx    Asthma Neg Hx    Birth defects Neg Hx    Cancer Neg Hx    COPD Neg Hx     Diabetes Neg Hx    Drug abuse Neg Hx    Early death Neg Hx    Hearing loss Neg Hx    Hyperlipidemia Neg Hx    Kidney disease Neg Hx    Varicose Veins Neg Hx    Vision loss Neg Hx    Stroke Neg Hx    Miscarriages / Stillbirths Neg Hx    Mental retardation Neg Hx    Mental illness Neg Hx    Learning disabilities Neg Hx     Social History   Tobacco Use   Smoking status: Every Day    Types: E-cigarettes   Smokeless tobacco: Never  Vaping Use   Vaping Use: Every day   Substances: Nicotine  Substance Use Topics   Alcohol use: Yes    Comment: not in 1-2 mos   Drug use: No    Home Medications Prior to Admission medications   Not on File    Allergies    Patient has no known allergies.  Review of Systems   Review of Systems  Constitutional:  Negative for appetite change, chills and fever.  HENT:  Negative for ear pain, rhinorrhea, sneezing and sore throat.   Eyes:  Negative for photophobia and visual disturbance.  Respiratory:  Negative for cough, chest tightness, shortness of breath and wheezing.  Cardiovascular:  Negative for chest pain and palpitations.  Gastrointestinal:  Positive for abdominal pain. Negative for blood in stool, constipation, diarrhea, nausea and vomiting.  Genitourinary:  Negative for dysuria, hematuria and urgency.  Musculoskeletal:  Negative for myalgias.  Skin:  Negative for rash.  Neurological:  Negative for dizziness, weakness and light-headedness.   Physical Exam Updated Vital Signs BP 118/71   Pulse 67   Temp 98.4 F (36.9 C)   Resp 20   SpO2 100%   Physical Exam Vitals and nursing note reviewed.  Constitutional:      General: He is not in acute distress.    Appearance: He is well-developed.  HENT:     Head: Normocephalic and atraumatic.     Nose: Nose normal.  Eyes:     General: No scleral icterus.       Left eye: No discharge.     Conjunctiva/sclera: Conjunctivae normal.  Cardiovascular:     Rate and Rhythm: Normal rate  and regular rhythm.     Heart sounds: Normal heart sounds. No murmur heard.   No friction rub. No gallop.  Pulmonary:     Effort: Pulmonary effort is normal. No respiratory distress.     Breath sounds: Normal breath sounds.  Abdominal:     General: Bowel sounds are normal. There is no distension.     Palpations: Abdomen is soft.     Tenderness: There is no abdominal tenderness. There is no guarding.  Musculoskeletal:        General: Normal range of motion.     Cervical back: Normal range of motion and neck supple.  Skin:    General: Skin is warm and dry.     Findings: No rash.  Neurological:     Mental Status: He is alert.     Motor: No abnormal muscle tone.     Coordination: Coordination normal.    ED Results / Procedures / Treatments   Labs (all labs ordered are listed, but only abnormal results are displayed) Labs Reviewed  COMPREHENSIVE METABOLIC PANEL - Abnormal; Notable for the following components:      Result Value   Glucose, Bld 137 (*)    All other components within normal limits  CBC WITH DIFFERENTIAL/PLATELET - Abnormal; Notable for the following components:   Lymphs Abs 4.1 (*)    Basophils Absolute 0.2 (*)    All other components within normal limits  RESP PANEL BY RT-PCR (FLU A&B, COVID) ARPGX2  LIPASE, BLOOD  URINALYSIS, ROUTINE W REFLEX MICROSCOPIC  CK    EKG None  Radiology CT ABDOMEN PELVIS W CONTRAST  Result Date: 08/26/2021 CLINICAL DATA:  Right lower quadrant abdominal pain. EXAM: CT ABDOMEN AND PELVIS WITH CONTRAST TECHNIQUE: Multidetector CT imaging of the abdomen and pelvis was performed using the standard protocol following bolus administration of intravenous contrast. CONTRAST:  12mL OMNIPAQUE IOHEXOL 300 MG/ML  SOLN COMPARISON:  CT chest abdomen and pelvis 12/30/2018. FINDINGS: Lower chest: No acute abnormality. Hepatobiliary: No focal liver abnormality is seen. No gallstones, gallbladder wall thickening, or biliary dilatation. Pancreas:  Unremarkable. No pancreatic ductal dilatation or surrounding inflammatory changes. Spleen: Normal in size without focal abnormality. Adrenals/Urinary Tract: Adrenal glands are unremarkable. Kidneys are normal, without renal calculi, focal lesion, or hydronephrosis. Bladder is unremarkable. Stomach/Bowel: The appendix is dilated measuring up to 1 cm in thickness. Appendicoliths are present. There is mild surrounding inflammatory stranding. No perforation or abscess. Otherwise, colon, small bowel and stomach are within normal limits. Vascular/Lymphatic: No significant vascular  findings are present. No enlarged abdominal or pelvic lymph nodes. Reproductive: Prostate is unremarkable. Other: No abdominal wall hernia or abnormality. No abdominopelvic ascites. Musculoskeletal: No fracture is seen. IMPRESSION: Enlarged appendix with mild surrounding inflammation compatible with acute appendicitis. No evidence for perforation or abscess. Electronically Signed   By: Darliss Cheney M.D.   On: 08/26/2021 17:10    Procedures Procedures   Medications Ordered in ED Medications  cefTRIAXone (ROCEPHIN) 2 g in sodium chloride 0.9 % 100 mL IVPB (has no administration in time range)    And  metroNIDAZOLE (FLAGYL) IVPB 500 mg (has no administration in time range)  sodium chloride 0.9 % bolus 1,000 mL (has no administration in time range)  iohexol (OMNIPAQUE) 300 MG/ML solution 100 mL (100 mLs Intravenous Contrast Given 08/26/21 1649)    ED Course  I have reviewed the triage vital signs and the nursing notes.  Pertinent labs & imaging results that were available during my care of the patient were reviewed by me and considered in my medical decision making (see chart for details).    MDM Rules/Calculators/A&P                           20 year old male presenting to the ED for right lower quadrant abdominal pain.  Abdomen is soft on exam.  Vital signs within normal limits.  Lab work ordered in triage including CMP,  CBC, lipase, urinalysis and CK are unremarkable.  Will need CT scan to rule out appendicitis due to location of pain.  CT of abdomen pelvis is concerning for acute appendicitis without perforation or abscess.  Patient has been n.p.o. for the past 11 hours.  Will order antibiotics, IV fluids, COVID testing and consult surgery.  Patient states that he would like to have his mother present "before we do anything else" but I did get his consent to consult surgery as I do not recommend waiting for his mother to arrive.   Portions of this note were generated with Scientist, clinical (histocompatibility and immunogenetics). Dictation errors may occur despite best attempts at proofreading.  Final Clinical Impression(s) / ED Diagnoses Final diagnoses:  Acute appendicitis, unspecified acute appendicitis type    Rx / DC Orders ED Discharge Orders     None        Dietrich Pates, PA-C 08/26/21 1720    Sloan Leiter, DO 08/27/21 1633

## 2021-08-27 ENCOUNTER — Encounter (HOSPITAL_COMMUNITY): Payer: Self-pay | Admitting: Surgery

## 2021-08-27 NOTE — Progress Notes (Signed)
Wasted of fentanyl with Carloyn Manner RN.

## 2021-08-28 ENCOUNTER — Telehealth: Payer: Self-pay | Admitting: Pediatrics

## 2021-08-28 LAB — SURGICAL PATHOLOGY

## 2021-08-28 NOTE — Telephone Encounter (Signed)
Transition Care Management Unsuccessful Follow-up Telephone Call  Date of discharge and from where:  [facility] 08/26/21  Attempts:  1st Attempt  Reason for unsuccessful TCM follow-up call:  Left voice message

## 2022-01-04 DIAGNOSIS — H5213 Myopia, bilateral: Secondary | ICD-10-CM | POA: Diagnosis not present

## 2022-07-28 DIAGNOSIS — N489 Disorder of penis, unspecified: Secondary | ICD-10-CM | POA: Diagnosis not present

## 2022-09-14 DIAGNOSIS — M25552 Pain in left hip: Secondary | ICD-10-CM | POA: Diagnosis not present

## 2022-09-14 DIAGNOSIS — R109 Unspecified abdominal pain: Secondary | ICD-10-CM | POA: Diagnosis not present

## 2022-10-27 DIAGNOSIS — J020 Streptococcal pharyngitis: Secondary | ICD-10-CM | POA: Diagnosis not present

## 2022-10-27 DIAGNOSIS — R519 Headache, unspecified: Secondary | ICD-10-CM | POA: Diagnosis not present

## 2022-12-13 ENCOUNTER — Encounter: Payer: Self-pay | Admitting: Internal Medicine

## 2022-12-13 ENCOUNTER — Ambulatory Visit (INDEPENDENT_AMBULATORY_CARE_PROVIDER_SITE_OTHER): Payer: Commercial Managed Care - PPO | Admitting: Internal Medicine

## 2022-12-13 VITALS — BP 122/77 | HR 62 | Ht 71.0 in | Wt 192.8 lb

## 2022-12-13 DIAGNOSIS — F17200 Nicotine dependence, unspecified, uncomplicated: Secondary | ICD-10-CM | POA: Diagnosis not present

## 2022-12-13 DIAGNOSIS — Z13220 Encounter for screening for lipoid disorders: Secondary | ICD-10-CM | POA: Diagnosis not present

## 2022-12-13 DIAGNOSIS — F32A Depression, unspecified: Secondary | ICD-10-CM

## 2022-12-13 DIAGNOSIS — Z2821 Immunization not carried out because of patient refusal: Secondary | ICD-10-CM | POA: Diagnosis not present

## 2022-12-13 DIAGNOSIS — F419 Anxiety disorder, unspecified: Secondary | ICD-10-CM | POA: Diagnosis not present

## 2022-12-13 DIAGNOSIS — Z1329 Encounter for screening for other suspected endocrine disorder: Secondary | ICD-10-CM | POA: Diagnosis not present

## 2022-12-13 DIAGNOSIS — Z0001 Encounter for general adult medical examination with abnormal findings: Secondary | ICD-10-CM

## 2022-12-13 DIAGNOSIS — Z1321 Encounter for screening for nutritional disorder: Secondary | ICD-10-CM

## 2022-12-13 DIAGNOSIS — I10 Essential (primary) hypertension: Secondary | ICD-10-CM

## 2022-12-13 DIAGNOSIS — F411 Generalized anxiety disorder: Secondary | ICD-10-CM

## 2022-12-13 DIAGNOSIS — Z1159 Encounter for screening for other viral diseases: Secondary | ICD-10-CM

## 2022-12-13 DIAGNOSIS — Z131 Encounter for screening for diabetes mellitus: Secondary | ICD-10-CM | POA: Diagnosis not present

## 2022-12-13 MED ORDER — ESCITALOPRAM OXALATE 10 MG PO TABS: 10.0000 mg | ORAL_TABLET | Freq: Every day | ORAL | 2 refills | Status: DC

## 2022-12-13 NOTE — Assessment & Plan Note (Signed)
BP today is 122/77.  He is not currently on any antihypertensive therapy. -No medication changes today.  No indication to start antihypertensive therapy.

## 2022-12-13 NOTE — Assessment & Plan Note (Signed)
Presenting today to establish care.  Available records and labs been reviewed. -Baseline labs ordered today, including one-time HCV screening -Outstanding vaccines were declined -We will tentatively plan for follow-up in 6 weeks

## 2022-12-13 NOTE — Patient Instructions (Signed)
It was a pleasure to see you today.  Thank you for giving [place] the opportunity to be involved in your care.  Below is a brief recap of your visit and next steps.  We will plan to see you again in 6 weeks.   Summary You have established care today  We will check labs Start Lexapro 10 mg daily for anxiety We will follow up in 6 weeks

## 2022-12-13 NOTE — Assessment & Plan Note (Signed)
Currently uses e-cigarettes daily.  Cessation strongly encouraged.

## 2022-12-13 NOTE — Assessment & Plan Note (Signed)
His acute concern today is poorly controlled symptoms of anxiety.  PHQ-9 and GAD-7 scores are elevated.  His anxiety is beginning to affect his relationship and he is interested in starting a daily medication for anxiety relief. -Start Lexapro 10 mg daily -Follow-up in 6 weeks for reassessment

## 2022-12-13 NOTE — Progress Notes (Signed)
New Patient Office Visit  Subjective    Patient ID: BRONCO STEM, male    DOB: 2000/11/15  Age: 22 y.o. MRN: [id number]  CC:  Chief Complaint  Patient presents with   Establish Care    HPI Joshua Preston presents to establish care.  He is a 22 year old male with a past medical history significant for HTN, anxiety/depression, and ADHD.  He he has not had a PCP in several years.  Joshua Preston acute concern today is poorly controlled anxiety.  He states that he has difficulty falling asleep at night because of racing thoughts and has difficulty controlling his temper because of anxiety.  His girlfriend has noted that it is beginning to affect the relationship and this has caused him significant concern.  He is interested in starting a daily medication for anxiety relief.  He is otherwise asymptomatic and has no additional concerns discussed.  He vapes e-cigarettes daily but denies alcohol and illicit drug use.  His family medical history is significant for ADD, CAD, HTN, and depression.  Acute concerns, chronic medical conditions, and outstanding preventative care was discussed today are individually addressed A/P below.  Outpatient Encounter Medications as of 12/13/2022  Medication Sig   escitalopram (LEXAPRO) 10 MG tablet Take 1 tablet (10 mg total) by mouth daily.   No facility-administered encounter medications on file as of 12/13/2022.    Past Medical History:  Diagnosis Date   Anxiety    Attention deficit    Depression    Loose body of right elbow     Past Surgical History:  Procedure Laterality Date   ELBOW ARTHROSCOPY Right 07/28/2020   Procedure: ARTHROSCOPY RIGHT ELBOW WITH DEBRIDEMENT AND LOOSE BODY EXCISION;  Surgeon: Elsie Saas, MD;  Location: [facility];  Service: Orthopedics;  Laterality: Right;   LAPAROSCOPIC APPENDECTOMY N/A 08/26/2021   Procedure: APPENDECTOMY LAPAROSCOPIC;  Surgeon: Ileana Roup, MD;  Location: [facility];  Service:  General;  Laterality: N/A;    Family History  Problem Relation Age of Onset   ADD / ADHD Mother    ADD / ADHD Sister    Heart disease Maternal Grandmother    Hypertension Maternal Grandmother    Depression Maternal Grandfather    Alcohol abuse Maternal Grandfather    Arthritis Neg Hx    Asthma Neg Hx    Birth defects Neg Hx    Cancer Neg Hx    COPD Neg Hx    Diabetes Neg Hx    Drug abuse Neg Hx    Early death Neg Hx    Hearing loss Neg Hx    Hyperlipidemia Neg Hx    Kidney disease Neg Hx    Varicose Veins Neg Hx    Vision loss Neg Hx    Stroke Neg Hx    Miscarriages / Stillbirths Neg Hx    Mental retardation Neg Hx    Mental illness Neg Hx    Learning disabilities Neg Hx     Social History   Socioeconomic History   Marital status: Single    Spouse name: Not on file   Number of children: Not on file   Years of education: Not on file   Highest education level: Not on file  Occupational History   Not on file  Tobacco Use   Smoking status: Every Day    Types: E-cigarettes   Smokeless tobacco: Never  Vaping Use   Vaping Use: Every day   Substances: Nicotine  Substance and Sexual  Activity   Alcohol use: Yes    Comment: not in 1-2 mos   Drug use: No   Sexual activity: Not on file  Other Topics Concern   Not on file  Social History Narrative   ** Merged History Encounter **       Social Determinants of Health   Financial Resource Strain: Not on file  Food Insecurity: Not on file  Transportation Needs: Not on file  Physical Activity: Not on file  Stress: Not on file  Social Connections: Not on file  Intimate Partner Violence: Not on file   Review of Systems  Constitutional:  Negative for chills and fever.  HENT:  Negative for sore throat.   Respiratory:  Negative for cough and shortness of breath.   Cardiovascular:  Negative for chest pain, palpitations and leg swelling.  Gastrointestinal:  Negative for abdominal pain, blood in stool, constipation,  diarrhea, nausea and vomiting.  Genitourinary:  Negative for dysuria and hematuria.  Musculoskeletal:  Negative for myalgias.  Skin:  Negative for itching and rash.  Neurological:  Negative for dizziness and headaches.  Psychiatric/Behavioral:  Negative for depression and suicidal ideas. The patient is nervous/anxious.    Objective    BP 122/77   Pulse 62   Ht 5\' 11"  (1.803 m)   Wt 192 lb 12.8 oz (87.5 kg)   SpO2 97%   BMI 26.89 kg/m   Physical Exam Vitals reviewed.  Constitutional:      General: He is not in acute distress.    Appearance: Normal appearance. He is not ill-appearing.  HENT:     Head: Normocephalic and atraumatic.     Right Ear: External ear normal.     Left Ear: External ear normal.     Nose: Nose normal. No congestion or rhinorrhea.     Mouth/Throat:     Mouth: Mucous membranes are moist.     Pharynx: Oropharynx is clear.  Eyes:     General: No scleral icterus.    Extraocular Movements: Extraocular movements intact.     Conjunctiva/sclera: Conjunctivae normal.     Pupils: Pupils are equal, round, and reactive to light.  Cardiovascular:     Rate and Rhythm: Normal rate and regular rhythm.     Pulses: Normal pulses.     Heart sounds: Normal heart sounds. No murmur heard. Pulmonary:     Effort: Pulmonary effort is normal.     Breath sounds: Normal breath sounds. No wheezing, rhonchi or rales.  Abdominal:     General: Abdomen is flat. Bowel sounds are normal. There is no distension.     Palpations: Abdomen is soft.     Tenderness: There is no abdominal tenderness.  Musculoskeletal:        General: No swelling or deformity. Normal range of motion.     Cervical back: Normal range of motion.  Skin:    General: Skin is warm and dry.     Capillary Refill: Capillary refill takes less than 2 seconds.  Neurological:     General: No focal deficit present.     Mental Status: He is alert and oriented to person, place, and time.     Motor: No weakness.   Psychiatric:        Mood and Affect: Mood normal.        Behavior: Behavior normal.        Thought Content: Thought content normal.    Assessment & Plan:   Problem List Items Addressed This Visit  Primary hypertension    BP today is 122/77.  He is not currently on any antihypertensive therapy. -No medication changes today.  No indication to start antihypertensive therapy.      Anxiety and depression    His acute concern today is poorly controlled symptoms of anxiety.  PHQ-9 and GAD-7 scores are elevated.  His anxiety is beginning to affect his relationship and he is interested in starting a daily medication for anxiety relief. -Start Lexapro 10 mg daily -Follow-up in 6 weeks for reassessment      Encounter for general adult medical examination with abnormal findings    Presenting today to establish care.  Available records and labs been reviewed. -Baseline labs ordered today, including one-time HCV screening -Outstanding vaccines were declined -We will tentatively plan for follow-up in 6 weeks      Vaping nicotine dependence, non-tobacco product    Currently uses e-cigarettes daily.  Cessation strongly encouraged.      Return in about 6 weeks (around 01/24/2023).   Johnette Abraham, MD

## 2022-12-14 ENCOUNTER — Other Ambulatory Visit: Payer: Self-pay

## 2022-12-14 DIAGNOSIS — R5383 Other fatigue: Secondary | ICD-10-CM

## 2022-12-15 LAB — CBC WITH DIFFERENTIAL/PLATELET
Basophils Absolute: 0 10*3/uL (ref 0.0–0.2)
Basos: 1 %
EOS (ABSOLUTE): 0.1 10*3/uL (ref 0.0–0.4)
Eos: 1 %
Hematocrit: 43.5 % (ref 37.5–51.0)
Hemoglobin: 14.7 g/dL (ref 13.0–17.7)
Immature Grans (Abs): 0 10*3/uL (ref 0.0–0.1)
Immature Granulocytes: 0 %
Lymphocytes Absolute: 2.1 10*3/uL (ref 0.7–3.1)
Lymphs: 34 %
MCH: 30.8 pg (ref 26.6–33.0)
MCHC: 33.8 g/dL (ref 31.5–35.7)
MCV: 91 fL (ref 79–97)
Monocytes Absolute: 0.7 10*3/uL (ref 0.1–0.9)
Monocytes: 12 %
Neutrophils Absolute: 3.3 10*3/uL (ref 1.4–7.0)
Neutrophils: 52 %
Platelets: 232 10*3/uL (ref 150–450)
RBC: 4.78 x10E6/uL (ref 4.14–5.80)
RDW: 12 % (ref 11.6–15.4)
WBC: 6.3 10*3/uL (ref 3.4–10.8)

## 2022-12-15 LAB — CMP14+EGFR
ALT: 19 IU/L (ref 0–44)
AST: 24 IU/L (ref 0–40)
Albumin/Globulin Ratio: 2.5 — ABNORMAL HIGH (ref 1.2–2.2)
Albumin: 5.2 g/dL (ref 4.3–5.2)
Alkaline Phosphatase: 59 IU/L (ref 44–121)
BUN/Creatinine Ratio: 16 (ref 9–20)
BUN: 18 mg/dL (ref 6–20)
Bilirubin Total: 0.7 mg/dL (ref 0.0–1.2)
CO2: 24 mmol/L (ref 20–29)
Calcium: 10 mg/dL (ref 8.7–10.2)
Chloride: 101 mmol/L (ref 96–106)
Creatinine, Ser: 1.11 mg/dL (ref 0.76–1.27)
Globulin, Total: 2.1 g/dL (ref 1.5–4.5)
Glucose: 90 mg/dL (ref 70–99)
Potassium: 4.2 mmol/L (ref 3.5–5.2)
Sodium: 141 mmol/L (ref 134–144)
Total Protein: 7.3 g/dL (ref 6.0–8.5)
eGFR: 97 mL/min/{1.73_m2} (ref >59)

## 2022-12-15 LAB — B12 AND FOLATE PANEL
Folate: 10 ng/mL (ref >3.0)
Vitamin B-12: 891 pg/mL (ref 232–1245)

## 2022-12-15 LAB — VITAMIN D 25 HYDROXY (VIT D DEFICIENCY, FRACTURES): Vit D, 25-Hydroxy: 36.3 ng/mL (ref 30.0–100.0)

## 2022-12-15 LAB — HCV INTERPRETATION

## 2022-12-15 LAB — LIPID PANEL
Chol/HDL Ratio: 2.7 ratio (ref 0.0–5.0)
Cholesterol, Total: 145 mg/dL (ref 100–199)
HDL: 53 mg/dL (ref >39)
LDL Chol Calc (NIH): 80 mg/dL (ref 0–99)
Triglycerides: 58 mg/dL (ref 0–149)
VLDL Cholesterol Cal: 12 mg/dL (ref 5–40)

## 2022-12-15 LAB — HEMOGLOBIN A1C
Est. average glucose Bld gHb Est-mCnc: 105 mg/dL
Hgb A1c MFr Bld: 5.3 % (ref 4.8–5.6)

## 2022-12-15 LAB — TSH+FREE T4
Free T4: 1.48 ng/dL (ref 0.82–1.77)
TSH: 2.82 u[IU]/mL (ref 0.450–4.500)

## 2022-12-15 LAB — HCV AB W REFLEX TO QUANT PCR: HCV Ab: NONREACTIVE

## 2023-01-24 ENCOUNTER — Encounter: Payer: Self-pay | Admitting: Internal Medicine

## 2023-01-24 ENCOUNTER — Ambulatory Visit (INDEPENDENT_AMBULATORY_CARE_PROVIDER_SITE_OTHER): Payer: Commercial Managed Care - PPO | Admitting: Internal Medicine

## 2023-01-24 VITALS — BP 110/67 | HR 76 | Ht 71.0 in | Wt 196.4 lb

## 2023-01-24 DIAGNOSIS — F32A Depression, unspecified: Secondary | ICD-10-CM | POA: Diagnosis not present

## 2023-01-24 DIAGNOSIS — F419 Anxiety disorder, unspecified: Secondary | ICD-10-CM

## 2023-01-24 MED ORDER — HYDROXYZINE PAMOATE 25 MG PO CAPS
25.0000 mg | ORAL_CAPSULE | Freq: Three times a day (TID) | ORAL | 0 refills | Status: DC | PRN
Start: 2023-01-24 — End: 2023-04-05

## 2023-01-24 NOTE — Patient Instructions (Addendum)
It was a pleasure to see you today.  Thank you for giving [place] the opportunity to be involved in your care.  Below is a brief recap of your visit and next steps.  We will plan to see you again in 1 year.  Summary Try hydroxyzine 25 mg as needed for anxiety relief We will tentatively plan for follow up in one year

## 2023-01-24 NOTE — Assessment & Plan Note (Signed)
Presenting today for follow-up of anxiety and depression.  Lexapro 10 mg daily was started at his last appointment as PHQ-9 and GAD-7 scores were both elevated.  He reported that his anxiety was beginning to affect his relationship.  Since being Lexapro, Mr. Silveira describes "feeling like a zombie".  He additionally describes anhedonia.  He would like to stop taking Lexapro and is interested in an as needed medication for anxiety relief. -Discontinue Lexapro -Start hydroxyzine 25 mg as needed for anxiety relief -We will tentatively plan for follow-up in 1 year, however he will return to care if his symptoms worsen or fail to improve with as needed use of hydroxyzine.

## 2023-01-24 NOTE — Progress Notes (Signed)
Established Patient Office Visit  Subjective   Patient ID: Joshua Preston, male    DOB: 2001-08-15  Age: 22 y.o. MRN: [id number]  Chief Complaint  Patient presents with   Depression    Follow up. Meds making him feel like he has no emotion and not really working    Joshua Preston returns to care today for follow-up of anxiety and depression.  He was last evaluated by me on 3/25 as a new patient presenting to establish care.  His acute concern at that time was poorly controlled symptoms of anxiety.  PHQ-9 and GAD-7 scores were both elevated.  He stated that his anxiety was beginning to affect his relationship and he requested to start a daily medication.  Lexapro 10 mg daily was started.  6-week follow-up was arranged for reassessment.  There have been no acute interval events.  Joshua Preston reports feeling fairly well today.  He states that Lexapro made him feel "like a zombie" and he would like to stop this medication today.  He is now interested in an as needed medication for anxiety relief.  He does not have any additional concerns to discuss today.  Past Medical History:  Diagnosis Date   Anxiety    Attention deficit    Depression    Loose body of right elbow    Past Surgical History:  Procedure Laterality Date   ELBOW ARTHROSCOPY Right 07/28/2020   Procedure: ARTHROSCOPY RIGHT ELBOW WITH DEBRIDEMENT AND LOOSE BODY EXCISION;  Surgeon: Salvatore Marvel, MD;  Location: [facility];  Service: Orthopedics;  Laterality: Right;   LAPAROSCOPIC APPENDECTOMY N/A 08/26/2021   Procedure: APPENDECTOMY LAPAROSCOPIC;  Surgeon: Andria Meuse, MD;  Location: [facility];  Service: General;  Laterality: N/A;   Social History   Tobacco Use   Smoking status: Every Day    Types: E-cigarettes   Smokeless tobacco: Never  Vaping Use   Vaping Use: Every day   Substances: Nicotine  Substance Use Topics   Alcohol use: Yes    Comment: not in 1-2 mos   Drug use: No   Family History   Problem Relation Age of Onset   ADD / ADHD Mother    ADD / ADHD Sister    Heart disease Maternal Grandmother    Hypertension Maternal Grandmother    Depression Maternal Grandfather    Alcohol abuse Maternal Grandfather    Arthritis Neg Hx    Asthma Neg Hx    Birth defects Neg Hx    Cancer Neg Hx    COPD Neg Hx    Diabetes Neg Hx    Drug abuse Neg Hx    Early death Neg Hx    Hearing loss Neg Hx    Hyperlipidemia Neg Hx    Kidney disease Neg Hx    Varicose Veins Neg Hx    Vision loss Neg Hx    Stroke Neg Hx    Miscarriages / Stillbirths Neg Hx    Mental retardation Neg Hx    Mental illness Neg Hx    Learning disabilities Neg Hx    No Known Allergies  Review of Systems  Constitutional:  Negative for chills and fever.  HENT:  Negative for sore throat.   Respiratory:  Negative for cough and shortness of breath.   Cardiovascular:  Negative for chest pain, palpitations and leg swelling.  Gastrointestinal:  Negative for abdominal pain, blood in stool, constipation, diarrhea, nausea and vomiting.  Genitourinary:  Negative for dysuria and hematuria.  Musculoskeletal:  Negative for myalgias.  Skin:  Negative for itching and rash.  Neurological:  Negative for dizziness and headaches.  Psychiatric/Behavioral:  Negative for depression and suicidal ideas.      Objective:     BP 110/67   Pulse 76   Ht 5\' 11"  (1.803 m)   Wt 196 lb 6.4 oz (89.1 kg)   SpO2 98%   BMI 27.39 kg/m  BP Readings from Last 3 Encounters:  01/24/23 110/67  12/13/22 122/77  08/26/21 120/66   Physical Exam Vitals reviewed.  Constitutional:      General: He is not in acute distress.    Appearance: Normal appearance. He is not ill-appearing.  HENT:     Head: Normocephalic and atraumatic.     Right Ear: External ear normal.     Left Ear: External ear normal.     Nose: Nose normal. No congestion or rhinorrhea.     Mouth/Throat:     Mouth: Mucous membranes are moist.     Pharynx: Oropharynx is  clear.  Eyes:     General: No scleral icterus.    Extraocular Movements: Extraocular movements intact.     Conjunctiva/sclera: Conjunctivae normal.     Pupils: Pupils are equal, round, and reactive to light.  Cardiovascular:     Rate and Rhythm: Normal rate and regular rhythm.     Pulses: Normal pulses.     Heart sounds: Normal heart sounds. No murmur heard. Pulmonary:     Effort: Pulmonary effort is normal.     Breath sounds: Normal breath sounds. No wheezing, rhonchi or rales.  Abdominal:     General: Abdomen is flat. Bowel sounds are normal. There is no distension.     Palpations: Abdomen is soft.     Tenderness: There is no abdominal tenderness.  Musculoskeletal:        General: No swelling or deformity. Normal range of motion.     Cervical back: Normal range of motion.  Skin:    General: Skin is warm and dry.     Capillary Refill: Capillary refill takes less than 2 seconds.  Neurological:     General: No focal deficit present.     Mental Status: He is alert and oriented to person, place, and time.     Motor: No weakness.  Psychiatric:        Mood and Affect: Mood normal.        Behavior: Behavior normal.        Thought Content: Thought content normal.   Last CBC Lab Results  Component Value Date   WBC 6.3 12/13/2022   HGB 14.7 12/13/2022   HCT 43.5 12/13/2022   MCV 91 12/13/2022   MCH 30.8 12/13/2022   RDW 12.0 12/13/2022   PLT 232 12/13/2022   Last metabolic panel Lab Results  Component Value Date   GLUCOSE 90 12/13/2022   NA 141 12/13/2022   K 4.2 12/13/2022   CL 101 12/13/2022   CO2 24 12/13/2022   BUN 18 12/13/2022   CREATININE 1.11 12/13/2022   EGFR 97 12/13/2022   CALCIUM 10.0 12/13/2022   PROT 7.3 12/13/2022   ALBUMIN 5.2 12/13/2022   LABGLOB 2.1 12/13/2022   AGRATIO 2.5 (H) 12/13/2022   BILITOT 0.7 12/13/2022   ALKPHOS 59 12/13/2022   AST 24 12/13/2022   ALT 19 12/13/2022   ANIONGAP 9 08/26/2021   Last lipids Lab Results  Component Value  Date   CHOL 145 12/13/2022   HDL 53 12/13/2022  LDLCALC 80 12/13/2022   TRIG 58 12/13/2022   CHOLHDL 2.7 12/13/2022   Last hemoglobin A1c Lab Results  Component Value Date   HGBA1C 5.3 12/13/2022   Last thyroid functions Lab Results  Component Value Date   TSH 2.820 12/13/2022   Last vitamin D Lab Results  Component Value Date   VD25OH 36.3 12/13/2022   Last vitamin B12 and Folate Lab Results  Component Value Date   VITAMINB12 891 12/13/2022   FOLATE 10.0 12/13/2022     Assessment & Plan:   Problem List Items Addressed This Visit       Anxiety and depression - Primary    Presenting today for follow-up of anxiety and depression.  Lexapro 10 mg daily was started at his last appointment as PHQ-9 and GAD-7 scores were both elevated.  He reported that his anxiety was beginning to affect his relationship.  Since being Lexapro, Joshua Preston describes "feeling like a zombie".  He additionally describes anhedonia.  He would like to stop taking Lexapro and is interested in an as needed medication for anxiety relief. -Discontinue Lexapro -Start hydroxyzine 25 mg as needed for anxiety relief -We will tentatively plan for follow-up in 1 year, however he will return to care if his symptoms worsen or fail to improve with as needed use of hydroxyzine.       Return in about 1 year (around 01/24/2024).    Billie Lade, MD

## 2023-02-01 DIAGNOSIS — S0181XA Laceration without foreign body of other part of head, initial encounter: Secondary | ICD-10-CM | POA: Diagnosis not present

## 2023-02-01 DIAGNOSIS — S0993XA Unspecified injury of face, initial encounter: Secondary | ICD-10-CM | POA: Diagnosis not present

## 2023-02-09 ENCOUNTER — Telehealth: Payer: Commercial Managed Care - PPO | Admitting: Internal Medicine

## 2023-02-09 NOTE — Progress Notes (Deleted)
   Virtual Visit via Video Note  I connected with Irene Pap on 02/09/23 at  1:00 PM EDT by a video enabled telemedicine application and verified that I am speaking with the correct person using two identifiers.  {Patient [phone]::"Home"} {Provider Location:[phone]::"Home Office"}  I discussed the limitations, risks, security, and privacy concerns of performing an evaluation and management service by video and the availability of in person appointments. I also discussed with the patient that there may be a patient responsible charge related to this service. The patient expressed understanding and agreed to proceed.  Subjective: PCP: Billie Lade, MD  No chief complaint on file.  HPI   ROS: Per HPI  Current Outpatient Medications:    hydrOXYzine (VISTARIL) 25 MG capsule, Take 1 capsule (25 mg total) by mouth every 8 (eight) hours as needed., Disp: 30 capsule, Rfl: 0  Observations/Objective: There were no vitals filed for this visit. Physical Exam  Assessment and Plan: There are no diagnoses linked to this encounter.  Follow Up Instructions: No follow-ups on file.   I discussed the assessment and treatment plan with the patient. The patient was provided an opportunity to ask questions, and all were answered. The patient agreed with the plan and demonstrated an understanding of the instructions.   The patient was advised to call back or seek an in-person evaluation if the symptoms worsen or if the condition fails to improve as anticipated.  The above assessment and management plan was discussed with the patient. The patient verbalized understanding of and has agreed to the management plan.   Billie Lade, MD

## 2023-02-18 ENCOUNTER — Ambulatory Visit (INDEPENDENT_AMBULATORY_CARE_PROVIDER_SITE_OTHER): Payer: Commercial Managed Care - PPO | Admitting: Internal Medicine

## 2023-02-18 ENCOUNTER — Encounter: Payer: Self-pay | Admitting: Internal Medicine

## 2023-02-18 VITALS — BP 121/67 | HR 61 | Ht 72.0 in | Wt 197.6 lb

## 2023-02-18 DIAGNOSIS — R197 Diarrhea, unspecified: Secondary | ICD-10-CM | POA: Diagnosis not present

## 2023-02-18 MED ORDER — VANCOMYCIN HCL 125 MG PO CAPS
125.0000 mg | ORAL_CAPSULE | Freq: Four times a day (QID) | ORAL | 0 refills | Status: AC
Start: 2023-02-18 — End: 2023-02-28

## 2023-02-18 NOTE — Patient Instructions (Signed)
It was a pleasure to see you today.  Thank you for giving [place] the opportunity to be involved in your care.  Below is a brief recap of your visit and next steps.  We will plan to see you again in June.  Summary Start vancomycin 125 mg 4 times per day  x 10 days Stool testing ordered Drinking plenty of water, probiotics and yogurt recommended Follow up for physical 6/14

## 2023-02-18 NOTE — Assessment & Plan Note (Signed)
Presenting today for evaluation of watery diarrhea that has been present for the last 2 weeks.  There have been no formed stools intermittently.  He experiences 5-6 episodes of diarrhea daily.  Reports recent antibiotic use, specifically clindamycin in the setting of a dental infection.  More recently he has felt weaker, which prompted him to schedule an appointment. -GI PCR, stool culture, and O&P ordered today -Will empirically treat for C. difficile given history findings today.  Oral vancomycin 125 mg 4 times daily x 10 days prescribed -He was instructed to present to the emergency department if his symptoms continue to worsen.  Otherwise he will return to care with me on 6/14 as previously scheduled.

## 2023-02-18 NOTE — Progress Notes (Signed)
Acute Office Visit  Subjective:     Patient ID: Joshua Preston, male    DOB: [birth year], 22 y.o.   MRN: [id number]  Chief Complaint  Patient presents with   GI Problem    Started 02/02/2023. Was put on abx then started having issues. No solid stools since.   Joshua Preston presents today for acute visit endorsing recent history of watery diarrhea.  He is currently having 5-6 episodes of watery diarrhea daily.  He has not had formed stools intermittently over the last 2 weeks.  He has not appreciated any blood in his stool.  He endorses symptoms of subjective fever/chills and weakness over the last 2 days.  Symptoms began around the time he started clindamycin for treatment of dental infection.  He endorses vague abdominal discomfort.  He has been drinking probiotics.  Symptoms have not improved.  He has felt weaker over the last 2 days, which led him to schedule an appointment today.  Review of Systems  Constitutional:  Positive for chills and malaise/fatigue. Negative for fever.  Gastrointestinal:  Positive for abdominal pain and diarrhea. Negative for blood in stool, constipation, melena, nausea and vomiting.      Objective:    BP 121/67   Pulse 61   Ht 6' (1.829 m)   Wt 197 lb 9.6 oz (89.6 kg)   SpO2 97%   BMI 26.80 kg/m  BP Readings from Last 3 Encounters:  02/18/23 121/67  01/24/23 110/67  12/13/22 122/77   Physical Exam Vitals reviewed.  Constitutional:      General: He is not in acute distress.    Appearance: Normal appearance. He is not ill-appearing.  HENT:     Head: Normocephalic and atraumatic.     Right Ear: External ear normal.     Left Ear: External ear normal.     Nose: Nose normal. No congestion or rhinorrhea.     Mouth/Throat:     Mouth: Mucous membranes are moist.     Pharynx: Oropharynx is clear.  Eyes:     General: No scleral icterus.    Extraocular Movements: Extraocular movements intact.     Conjunctiva/sclera: Conjunctivae normal.     Pupils:  Pupils are equal, round, and reactive to light.  Cardiovascular:     Rate and Rhythm: Normal rate and regular rhythm.     Pulses: Normal pulses.     Heart sounds: Normal heart sounds. No murmur heard. Pulmonary:     Effort: Pulmonary effort is normal.     Breath sounds: Normal breath sounds. No wheezing, rhonchi or rales.  Abdominal:     General: Abdomen is flat. Bowel sounds are normal. There is no distension.     Palpations: Abdomen is soft.     Tenderness: There is no abdominal tenderness.  Musculoskeletal:        General: No swelling or deformity. Normal range of motion.     Cervical back: Normal range of motion.  Skin:    General: Skin is warm and dry.     Capillary Refill: Capillary refill takes less than 2 seconds.  Neurological:     General: No focal deficit present.     Mental Status: He is alert and oriented to person, place, and time.     Motor: No weakness.  Psychiatric:        Mood and Affect: Mood normal.        Behavior: Behavior normal.        Thought Content: Thought content  normal.       Assessment & Plan:   Problem List Items Addressed This Visit       Watery diarrhea - Primary    Presenting today for evaluation of watery diarrhea that has been present for the last 2 weeks.  There have been no formed stools intermittently.  He experiences 5-6 episodes of diarrhea daily.  Reports recent antibiotic use, specifically clindamycin in the setting of a dental infection.  More recently he has felt weaker, which prompted him to schedule an appointment. -GI PCR, stool culture, and O&P ordered today -Will empirically treat for C. difficile given history findings today.  Oral vancomycin 125 mg 4 times daily x 10 days prescribed -He was instructed to present to the emergency department if his symptoms continue to worsen.  Otherwise he will return to care with me on 6/14 as previously scheduled.       Meds ordered this encounter  Medications   vancomycin (VANCOCIN)  125 MG capsule    Sig: Take 1 capsule (125 mg total) by mouth 4 (four) times daily for 10 days.    Dispense:  40 capsule    Refill:  0    Return if symptoms worsen or fail to improve.  Billie Lade, MD

## 2023-02-21 DIAGNOSIS — R197 Diarrhea, unspecified: Secondary | ICD-10-CM | POA: Diagnosis not present

## 2023-02-23 LAB — GI PROFILE, STOOL, PCR

## 2023-02-24 LAB — OVA AND PARASITE EXAMINATION

## 2023-02-24 LAB — STOOL CULTURE: E coli, Shiga toxin Assay: NEGATIVE

## 2023-02-25 LAB — STOOL CULTURE

## 2023-02-26 LAB — STOOL CULTURE

## 2023-03-04 ENCOUNTER — Encounter: Payer: Commercial Managed Care - PPO | Admitting: Internal Medicine

## 2023-04-05 ENCOUNTER — Ambulatory Visit (INDEPENDENT_AMBULATORY_CARE_PROVIDER_SITE_OTHER): Payer: Commercial Managed Care - PPO | Admitting: Internal Medicine

## 2023-04-05 ENCOUNTER — Encounter: Payer: Self-pay | Admitting: Internal Medicine

## 2023-04-05 VITALS — BP 123/76 | HR 85 | Resp 16 | Ht 72.0 in | Wt 199.0 lb

## 2023-04-05 DIAGNOSIS — R197 Diarrhea, unspecified: Secondary | ICD-10-CM | POA: Diagnosis not present

## 2023-04-05 NOTE — Progress Notes (Signed)
   HPI:Mr.Joshua Preston is a 22 y.o. male who presents for evaluation of diarrhea .  Patient's diarrhea started in the middle of May after being prescribed clindamycin for a dental infection.  He was seen by Dr. Durwin Nora on 5/31 and GI PCR, stool culture, and O&P ordered.  He was prescribed vancomycin 125 mg 4 times daily x 10 days for empiric C. difficile treatment.  Testing all come back negative . He usually has to go to bathroom shortly after eating.  He continues to have 5 to 6 bowel movements on some days. Some days his bowel movements are improved. Improved some with taking Vancomycin but he did not finish taking.  Brown stool and described as apple sauce consistency. He has recently noticed blood when wiping. He has know hemorrhoids secondary he developed from heavy weight lifting.  His diet is consistent. He has tried a probiotic, but not using daily.   Physical Exam: Vitals:   04/05/23 1602  BP: 123/76  Pulse: 85  Resp: 16  Weight: 199 lb (90.3 kg)  Height: 6' (1.829 m)     Physical Exam Constitutional:      General: He is not in acute distress.    Appearance: Normal appearance.  Cardiovascular:     Rate and Rhythm: Normal rate and regular rhythm.     Heart sounds: No murmur heard. Pulmonary:     Effort: Pulmonary effort is normal.     Breath sounds: No wheezing or rales.  Abdominal:     General: Bowel sounds are normal. There is no distension.     Palpations: Abdomen is soft. There is no mass.     Tenderness: There is no abdominal tenderness. There is no guarding or rebound.     Hernia: No hernia is present.   Patient deferred rectal exam   Assessment & Plan:   Rajkumar was seen today for diarrhea.  Watery diarrhea Assessment & Plan: Acute problem, not resolving. I discussed referral to gastroenterology and they could offer colonoscopy. Patient not interested at this time. This is likely from imbalance of flora after antibiotic use.  Recommending probiotic use No  signs of dehydration He can use Imodium for symptoms Follow up in one month.        Milus Banister, MD

## 2023-04-05 NOTE — Patient Instructions (Addendum)
Thank you, Mr.Kalif C Masoner for allowing [place] to provide your care today.   You can take imodium as needed to help with symptoms  Follow up with Dr.Dixon in August.    Thurmon Fair, M.D.

## 2023-04-07 NOTE — Assessment & Plan Note (Signed)
Acute problem, not resolving. I discussed referral to gastroenterology and they could offer colonoscopy. Patient not interested at this time. This is likely from imbalance of flora after antibiotic use.  Recommending probiotic use No signs of dehydration He can use Imodium for symptoms Follow up in one month.

## 2023-04-22 ENCOUNTER — Encounter: Payer: Commercial Managed Care - PPO | Admitting: Internal Medicine

## 2023-06-14 ENCOUNTER — Telehealth: Payer: Self-pay | Admitting: Internal Medicine

## 2023-06-14 NOTE — Telephone Encounter (Signed)
Patient called has a rash broken out came in contact with poisn ivy,  he has put pvc glue to stop the itch.He travels and works outside any recommendation what he needs to do?  If something can be be called into his pharmacy,  his fiance could pick it up. Call patient at [phone].  Pharmacy: [facility]

## 2023-06-16 NOTE — Telephone Encounter (Signed)
Patient advised.

## 2023-09-02 ENCOUNTER — Ambulatory Visit: Payer: Self-pay | Admitting: Internal Medicine

## 2023-09-02 NOTE — Telephone Encounter (Signed)
  Chief Complaint: Abdominal bloating and gas Symptoms: Abdominal bloating, gas, irregular frequency of bowel movements Frequency: Past week Pertinent Negatives: Patient denies SOB, blood in stool, watery stools Disposition: [] ED /[] Urgent Care (no appt availability in office) / [] Appointment(In office/virtual)/ [x]  [facility]/ [] Home Care/ [] Refused Recommended Disposition /[] [facility]/ []  Follow-up with PCP Additional Notes: Patient calls in reporting abdominal bloating and increased gas for the last week. Pt reports some belching and irregular frequency of bowel movements with normal consistency. Pt has tried nothing OTC. Denies SOB, blood in stool, history of digestive issues. He reports pain of 6/10 intermittently and reports feeling "heavy". No appt available with PCP, patient declines next available AM appt as he needs afternoon visit. He reports he will "wait it out' until he is able to get an appt in office. Will send request to PCP to work in patient to see provider sooner.   Reason for Disposition  [1] Abdomen BLOATING (e.g., feeling full or gassy; no visible swelling) lasts > 1 week AND [2] no improvement after using Care Advice  Answer Assessment - Initial Assessment Questions 1. SYMPTOM: "What's the main symptom you're concerned about?" (e.g., abdomen bloating, swelling)     Abdominal bloating, gas 2. ONSET: "When did symptoms  start?"     About 1 week 3. SEVERITY: "How bad is the bloating or swelling?"    - BLOATING: Feels gassy or bloated. No visible swelling.     - MILD SWELLING: Feels gassy or bloated. Abdomen looks mildly distended or swollen.    - MODERATE - SEVERE SWELLING: Abdomen looks very distended or swollen.      Mild 4. ABDOMEN PAIN:  "Is there any abdomen pain?" If Yes, ask: "How bad is the pain?"  (e.g., Scale 1-10; mild, moderate, or severe)   - NONE (0): No pain.   - MILD (1-3): Doesn't interfere with normal activities, abdomen soft  and not tender to touch.    - MODERATE (4-7): Interferes with normal activities or awakens from sleep, abdomen tender to touch.    - SEVERE (8-10): Excruciating pain, doubled over, unable to do any normal activities.       6, intermittent 5. RELIEVING AND AGGRAVATING FACTORS: "What makes it better or worse?" (e.g., certain foods, lactose, medicines)     No 6. GI HISTORY: "Do you have any history of stomach or intestine problems?" (e.g., bowel obstruction, cancer, irritable bowel)      None 7. CAUSE: "What do you think is causing the bloating?"      Unknown, no change in diet or activity 8. OTHER SYMPTOMS: "Do you have any other symptoms?" (e.g., belching, blood in stool, breathing difficulty, constipation, diarrhea, fever, passing gas, vomiting, weight loss, white of eyes have turned yellow)     Belching, "breathing harder to get air sometimes"  Protocols used: Abdomen Bloating and Swelling-A-AH

## 2023-09-02 NOTE — Telephone Encounter (Signed)
Scheduled 12.23.2025 and cpe for 2.05.2025

## 2023-09-04 IMAGING — CT CT ABD-PELV W/ CM
2 of 4 series · 16 of 46 positions shown, 18 images · IV contrast (Omni 300)
Comparison: CT chest abdomen and pelvis 12/30/2018.

CLINICAL DATA: Right lower quadrant abdominal pain.

EXAM:
CT ABDOMEN AND PELVIS WITH CONTRAST
TECHNIQUE: Multidetector CT imaging of the abdomen and pelvis was performed
using the standard protocol following bolus administration of
intravenous contrast.
CONTRAST:  100mL OMNIPAQUE IOHEXOL 300 MG/ML  SOLN

[Series 3: a/p w/ 5mm · axial · 0.80mm/px · z∈[+911,+1346]mm · 13 of 95 slices shown, 15 images]
[im 4/95  soft-tissue]
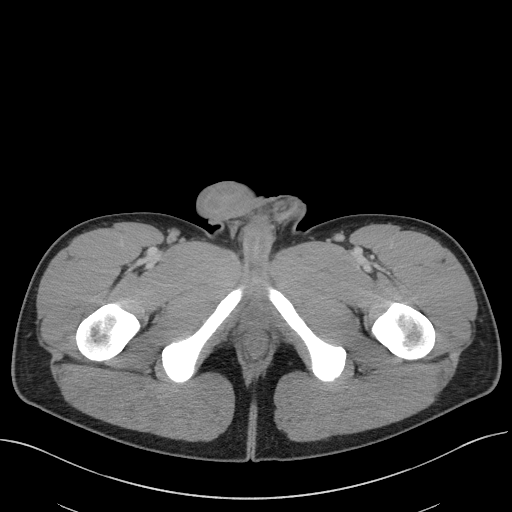
[im 4/95  bone]
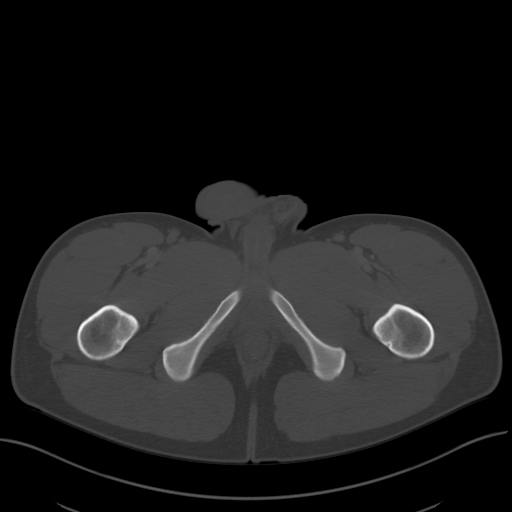
[im 12/95  soft-tissue]
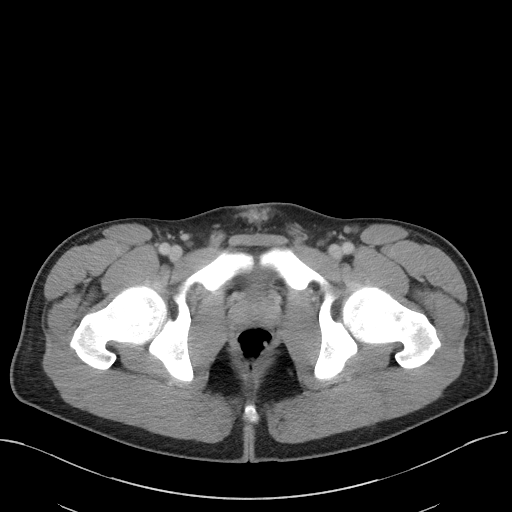
[im 19/95  soft-tissue]
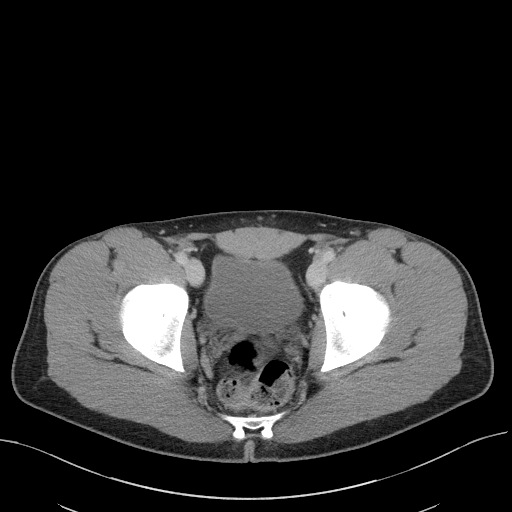
[im 27/95  soft-tissue]
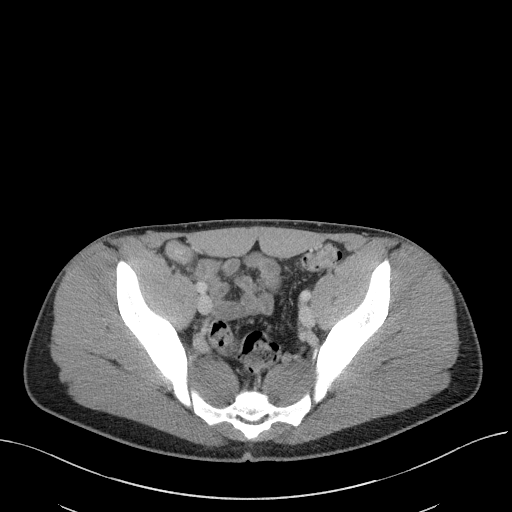
[im 34/95  soft-tissue]
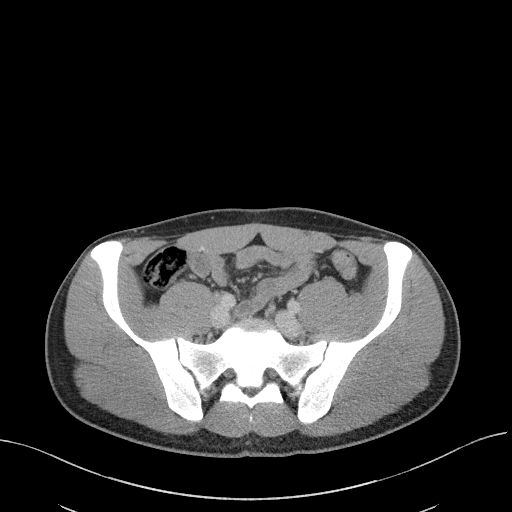
[im 42/95  soft-tissue]
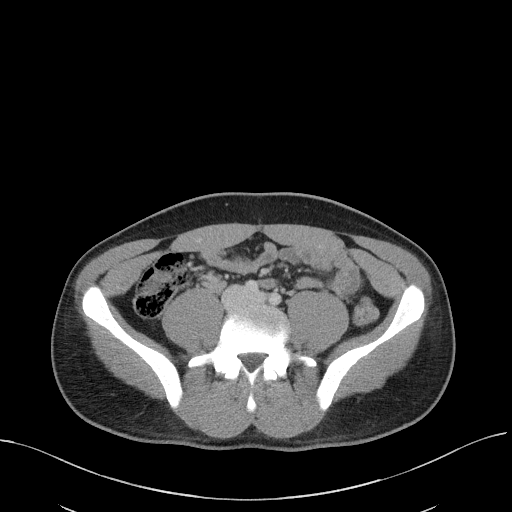
[im 49/95  soft-tissue]
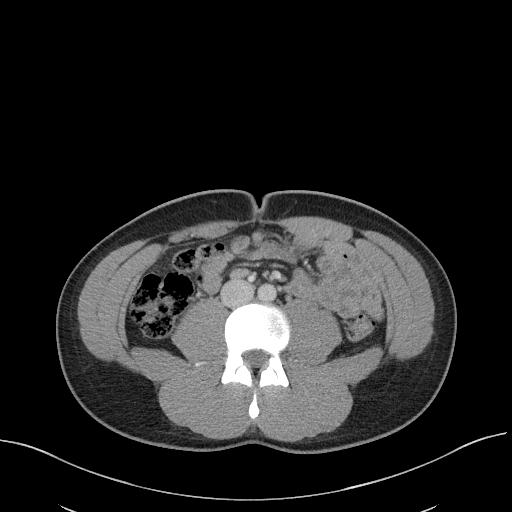
[im 53/95  soft-tissue]
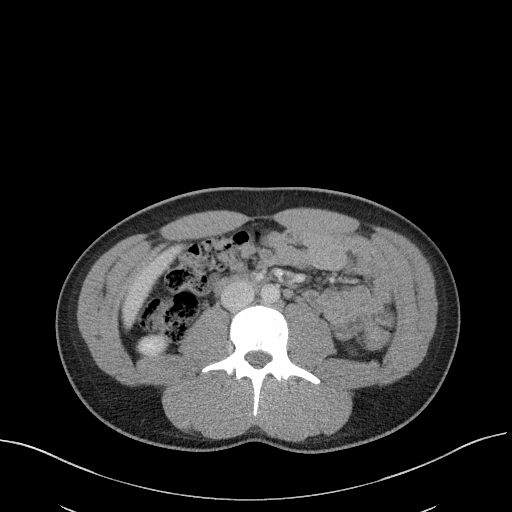
[im 61/95  soft-tissue]
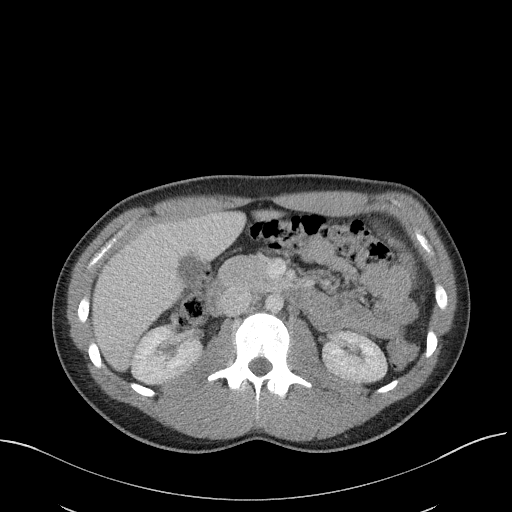
[im 61/95  bone]
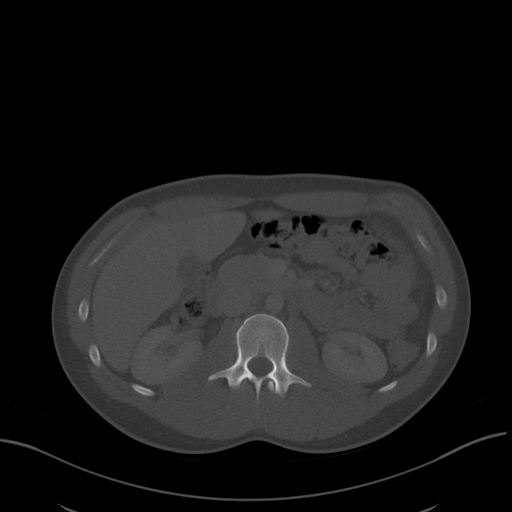
[im 68/95  soft-tissue]
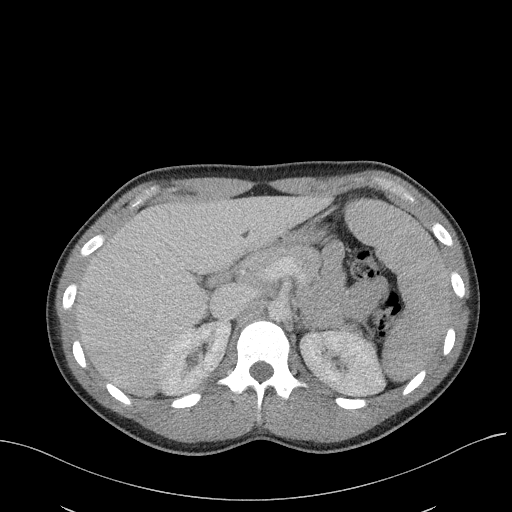
[im 76/95  soft-tissue]
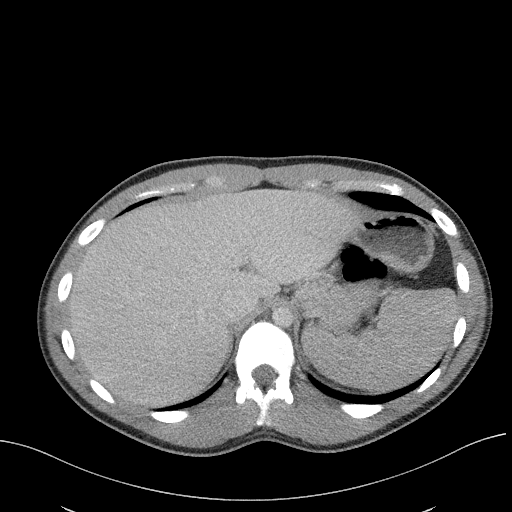
[im 83/95  soft-tissue]
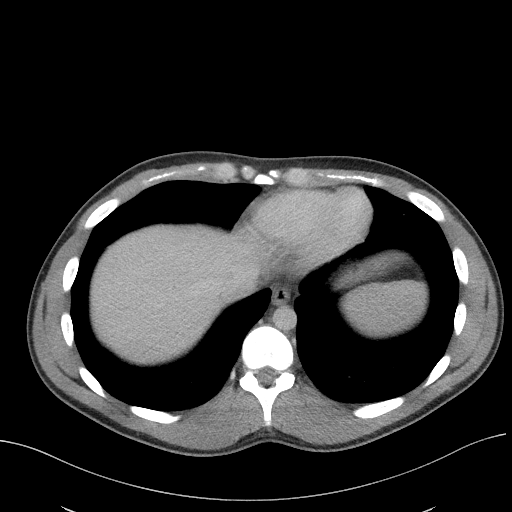
[im 91/95  soft-tissue]
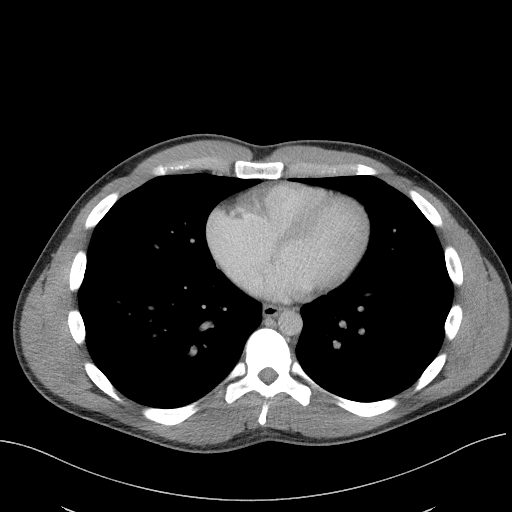

[Series 6: a/p w/ cor · coronal · 0.79mm/px · 3 of 151 slices shown]
[im 51/151  soft-tissue]
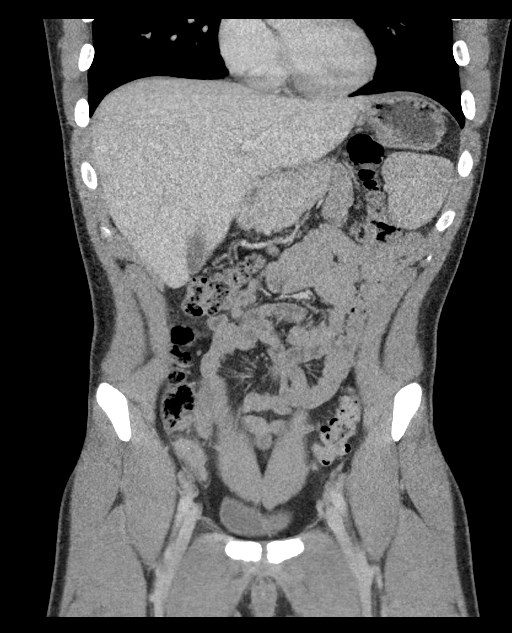
[im 67/151  soft-tissue]
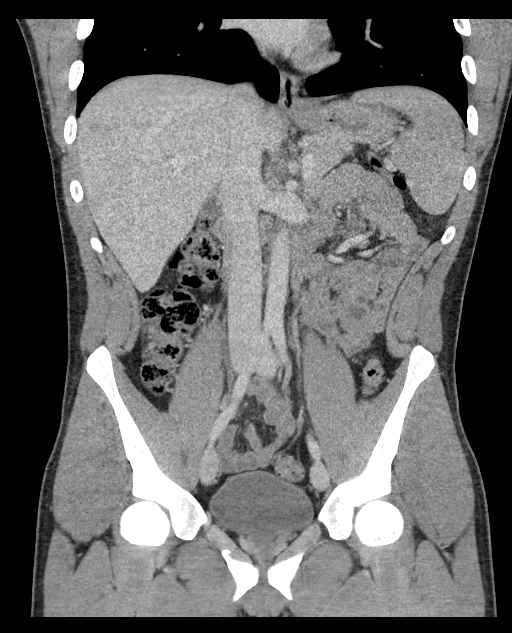
[im 84/151  soft-tissue]
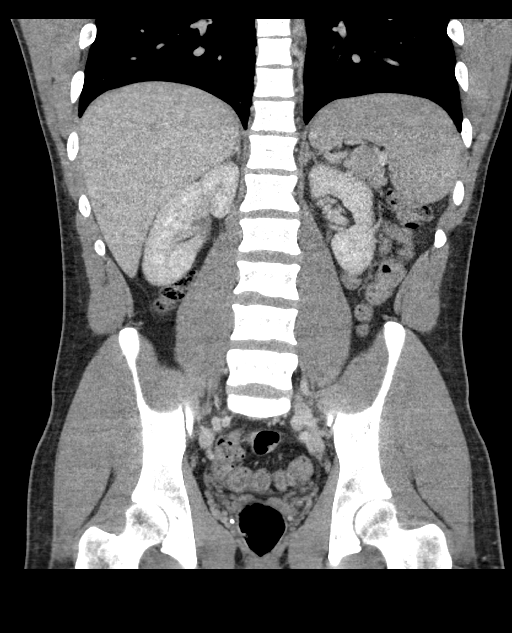

[16 of 46 positions shown; findings below may reference images not displayed]

FINDINGS: Lower chest: No acute abnormality.

Hepatobiliary: No focal liver abnormality is seen. No gallstones,
gallbladder wall thickening, or biliary dilatation.

Pancreas: Unremarkable. No pancreatic ductal dilatation or
surrounding inflammatory changes.

Spleen: Normal in size without focal abnormality.

Adrenals/Urinary Tract: Adrenal glands are unremarkable. Kidneys are
normal, without renal calculi, focal lesion, or hydronephrosis.
Bladder is unremarkable.

Stomach/Bowel: The appendix is dilated measuring up to 1 cm in
thickness. Appendicoliths are present. There is mild surrounding
inflammatory stranding. No perforation or abscess.

Otherwise, colon, small bowel and stomach are within normal limits.

Vascular/Lymphatic: No significant vascular findings are present. No
enlarged abdominal or pelvic lymph nodes.

Reproductive: Prostate is unremarkable.

Other: No abdominal wall hernia or abnormality. No abdominopelvic
ascites.

Musculoskeletal: No fracture is seen.
IMPRESSION: Enlarged appendix with mild surrounding inflammation compatible with
acute appendicitis. No evidence for perforation or abscess.

## 2023-09-12 ENCOUNTER — Encounter: Payer: Self-pay | Admitting: Internal Medicine

## 2023-09-12 ENCOUNTER — Ambulatory Visit (INDEPENDENT_AMBULATORY_CARE_PROVIDER_SITE_OTHER): Payer: Commercial Managed Care - PPO | Admitting: Internal Medicine

## 2023-09-12 VITALS — BP 94/57 | HR 98 | Ht 72.0 in | Wt 205.8 lb

## 2023-09-12 DIAGNOSIS — R143 Flatulence: Secondary | ICD-10-CM

## 2023-09-12 DIAGNOSIS — M25532 Pain in left wrist: Secondary | ICD-10-CM

## 2023-09-12 DIAGNOSIS — M7542 Impingement syndrome of left shoulder: Secondary | ICD-10-CM

## 2023-09-12 NOTE — Progress Notes (Signed)
Acute Office Visit  Subjective:     Patient ID: Joshua Preston, male    DOB: 2001-08-29, 22 y.o.   MRN: [id number]  Chief Complaint  Patient presents with   Follow-up    Shoulder/wrist pain from fall    Mr. Highland presents today for an acute visit with multiple concerns discussed.  He has previously endorsed watery diarrhea.  Infectious workup was unremarkable.  He states that he stopped drinking [employer] energy drinks and symptoms improved.  Today he continues to endorse flatulence.  He is taking Gas-X regularly and would like additional treatment recommendations.  He was previously taking a probiotic and feels that it was helpful.  His additional concern today is left shoulder discomfort, mostly with overhead movement.  Symptoms have been present for the last 2 weeks.  He endorses a remote history of fall but states that he landed on his feet and braced himself with his left arm.  Review of Systems  Gastrointestinal:        Flatulence  Musculoskeletal:  Positive for joint pain (Left shoulder and wrist).  All other systems reviewed and are negative.     Objective:    BP (!) 94/57 (BP Location: Right Arm, Patient Position: Sitting, Cuff Size: Large)   Pulse 98   Ht 6' (1.829 m)   Wt 205 lb 12.8 oz (93.4 kg)   SpO2 98%   BMI 27.91 kg/m   Physical Exam Vitals reviewed.  Constitutional:      General: He is not in acute distress.    Appearance: Normal appearance. He is not ill-appearing.  HENT:     Head: Normocephalic and atraumatic.     Right Ear: External ear normal.     Left Ear: External ear normal.     Nose: Nose normal. No congestion or rhinorrhea.     Mouth/Throat:     Mouth: Mucous membranes are moist.     Pharynx: Oropharynx is clear.  Eyes:     General: No scleral icterus.    Extraocular Movements: Extraocular movements intact.     Conjunctiva/sclera: Conjunctivae normal.     Pupils: Pupils are equal, round, and reactive to light.  Cardiovascular:     Rate  and Rhythm: Normal rate and regular rhythm.     Pulses: Normal pulses.     Heart sounds: Normal heart sounds. No murmur heard. Pulmonary:     Effort: Pulmonary effort is normal.     Breath sounds: Normal breath sounds. No wheezing, rhonchi or rales.  Abdominal:     General: Abdomen is flat. Bowel sounds are normal. There is no distension.     Palpations: Abdomen is soft.     Tenderness: There is no abdominal tenderness.  Musculoskeletal:        General: No swelling or deformity. Normal range of motion.     Cervical back: Normal range of motion.     Comments: No obvious deformities on inspection of the left shoulder and wrist.  Pain is elicited along the superior aspect of the left shoulder with overhead motions.  Positive empty can.  There is an audible click with extension of the left wrist.  Skin:    General: Skin is warm and dry.     Capillary Refill: Capillary refill takes less than 2 seconds.  Neurological:     General: No focal deficit present.     Mental Status: He is alert and oriented to person, place, and time.     Motor: No weakness.  Psychiatric:        Mood and Affect: Mood normal.        Behavior: Behavior normal.        Thought Content: Thought content normal.       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  Pain is worse with overhead motions.  Positive empty can test on exam. -Home PT exercises provided today.  He will return to care for previously scheduled follow-up in early February 2025.      Left wrist pain   He endorses pain along the dorsal aspect of the left wrist.  An audible click is appreciated with extension.  ROM is generally intact.  Grip strength intact.  There is no effusion or erythema present.  He endorses a recent fall during which he braced himself with his left arm.  Suspect current "clicking"  is related to inflammation of a forearm extensor.  Home PT exercises reviewed today.  He will return to care if symptoms worsen or fail to improve.      Flatulence   Diarrhea has resolved, however he currently endorses flatulence.  He is taking Gas-X regularly.  I recommended resuming daily probiotic use.      Return if symptoms worsen or fail to improve.  Billie Lade, MD

## 2023-09-12 NOTE — Assessment & Plan Note (Signed)
His acute concern today is left shoulder pain along the superior aspect.  Pain is worse with overhead motions.  Positive empty can test on exam. -Home PT exercises provided today.  He will return to care for previously scheduled follow-up in early February 2025.

## 2023-09-12 NOTE — Assessment & Plan Note (Signed)
Diarrhea has resolved, however he currently endorses flatulence.  He is taking Gas-X regularly.  I recommended resuming daily probiotic use.

## 2023-09-12 NOTE — Assessment & Plan Note (Signed)
He endorses pain along the dorsal aspect of the left wrist.  An audible click is appreciated with extension.  ROM is generally intact.  Grip strength intact.  There is no effusion or erythema present.  He endorses a recent fall during which he braced himself with his left arm.  Suspect current "clicking" is related to inflammation of a forearm extensor.  Home PT exercises reviewed today.  He will return to care if symptoms worsen or fail to improve.

## 2023-09-12 NOTE — Patient Instructions (Signed)
It was a pleasure to see you today.  Thank you for giving [place] the opportunity to be involved in your care.  Below is a brief recap of your visit and next steps.  We will plan to see you again in February  Summary See the attached exercises for your shoulder I recommend resuming a daily probiotic, consider yogurt such as Activia Follow up as scheduled in February

## 2023-10-26 ENCOUNTER — Encounter: Payer: Commercial Managed Care - PPO | Admitting: Internal Medicine

## 2024-01-23 ENCOUNTER — Ambulatory Visit: Admitting: Internal Medicine

## 2024-02-06 ENCOUNTER — Encounter: Payer: Self-pay | Admitting: Internal Medicine

## 2024-08-14 DIAGNOSIS — S61412A Laceration without foreign body of left hand, initial encounter: Secondary | ICD-10-CM | POA: Diagnosis not present

## 2024-08-14 DIAGNOSIS — Z6829 Body mass index (BMI) 29.0-29.9, adult: Secondary | ICD-10-CM | POA: Diagnosis not present

## 2024-08-14 DIAGNOSIS — E663 Overweight: Secondary | ICD-10-CM | POA: Diagnosis not present

## 2024-08-14 DIAGNOSIS — R030 Elevated blood-pressure reading, without diagnosis of hypertension: Secondary | ICD-10-CM | POA: Diagnosis not present
